# Patient Record
Sex: Male | Born: 1975 | Race: White | Hispanic: No | Marital: Married | Smoking: Current every day smoker
Health system: Southern US, Community
[De-identification: ages and names within clinical notes are randomized; demographics above are authoritative.]

## PROBLEM LIST (undated history)

## (undated) DIAGNOSIS — G4730 Sleep apnea, unspecified: Secondary | ICD-10-CM

## (undated) DIAGNOSIS — E7800 Pure hypercholesterolemia, unspecified: Secondary | ICD-10-CM

## (undated) DIAGNOSIS — R002 Palpitations: Secondary | ICD-10-CM

## (undated) DIAGNOSIS — I10 Essential (primary) hypertension: Secondary | ICD-10-CM

## (undated) DIAGNOSIS — I456 Pre-excitation syndrome: Secondary | ICD-10-CM

## (undated) DIAGNOSIS — I4891 Unspecified atrial fibrillation: Secondary | ICD-10-CM

---

## 2004-03-26 ENCOUNTER — Other Ambulatory Visit: Payer: Self-pay

## 2004-07-05 ENCOUNTER — Ambulatory Visit: Payer: Self-pay | Admitting: Anesthesiology

## 2004-08-02 ENCOUNTER — Ambulatory Visit: Payer: Self-pay | Admitting: Anesthesiology

## 2004-09-01 ENCOUNTER — Ambulatory Visit: Payer: Self-pay | Admitting: Anesthesiology

## 2004-10-13 ENCOUNTER — Ambulatory Visit: Payer: Self-pay | Admitting: Anesthesiology

## 2004-11-09 ENCOUNTER — Ambulatory Visit: Payer: Self-pay | Admitting: Anesthesiology

## 2004-12-06 ENCOUNTER — Ambulatory Visit: Payer: Self-pay | Admitting: Anesthesiology

## 2005-01-05 ENCOUNTER — Ambulatory Visit: Payer: Self-pay | Admitting: Anesthesiology

## 2005-02-07 ENCOUNTER — Ambulatory Visit: Payer: Self-pay | Admitting: Anesthesiology

## 2005-03-09 ENCOUNTER — Ambulatory Visit: Payer: Self-pay | Admitting: Anesthesiology

## 2005-03-17 ENCOUNTER — Ambulatory Visit: Payer: Self-pay | Admitting: Anesthesiology

## 2005-03-29 ENCOUNTER — Ambulatory Visit: Payer: Self-pay | Admitting: Anesthesiology

## 2005-04-26 ENCOUNTER — Ambulatory Visit: Payer: Self-pay | Admitting: Anesthesiology

## 2005-06-01 ENCOUNTER — Ambulatory Visit: Payer: Self-pay | Admitting: Anesthesiology

## 2005-06-27 ENCOUNTER — Ambulatory Visit: Payer: Self-pay | Admitting: Anesthesiology

## 2005-07-31 ENCOUNTER — Ambulatory Visit: Payer: Self-pay | Admitting: Anesthesiology

## 2005-08-28 ENCOUNTER — Ambulatory Visit: Payer: Self-pay | Admitting: Anesthesiology

## 2005-09-20 ENCOUNTER — Ambulatory Visit: Payer: Self-pay | Admitting: Anesthesiology

## 2005-10-31 ENCOUNTER — Ambulatory Visit: Payer: Self-pay | Admitting: Anesthesiology

## 2005-11-30 ENCOUNTER — Ambulatory Visit: Payer: Self-pay | Admitting: Anesthesiology

## 2005-12-28 ENCOUNTER — Ambulatory Visit: Payer: Self-pay | Admitting: Anesthesiology

## 2006-01-04 ENCOUNTER — Ambulatory Visit: Payer: Self-pay | Admitting: Anesthesiology

## 2006-01-04 ENCOUNTER — Other Ambulatory Visit: Payer: Self-pay

## 2006-01-23 ENCOUNTER — Ambulatory Visit: Payer: Self-pay | Admitting: Anesthesiology

## 2006-02-20 ENCOUNTER — Ambulatory Visit: Payer: Self-pay | Admitting: Anesthesiology

## 2006-02-25 ENCOUNTER — Emergency Department: Payer: Self-pay | Admitting: Emergency Medicine

## 2006-02-25 ENCOUNTER — Other Ambulatory Visit: Payer: Self-pay

## 2006-07-26 ENCOUNTER — Emergency Department: Payer: Self-pay | Admitting: Emergency Medicine

## 2007-11-16 ENCOUNTER — Ambulatory Visit: Payer: Self-pay | Admitting: Family Medicine

## 2007-12-09 ENCOUNTER — Encounter: Admission: RE | Admit: 2007-12-09 | Discharge: 2007-12-09 | Payer: Self-pay | Admitting: Neurosurgery

## 2008-03-16 ENCOUNTER — Emergency Department: Payer: Self-pay | Admitting: Emergency Medicine

## 2008-03-17 ENCOUNTER — Other Ambulatory Visit: Payer: Self-pay

## 2008-11-17 ENCOUNTER — Inpatient Hospital Stay: Payer: Self-pay | Admitting: Psychiatry

## 2008-11-20 ENCOUNTER — Ambulatory Visit: Payer: Self-pay | Admitting: Unknown Physician Specialty

## 2008-11-30 ENCOUNTER — Ambulatory Visit: Payer: Self-pay | Admitting: Unknown Physician Specialty

## 2008-12-31 ENCOUNTER — Ambulatory Visit: Payer: Self-pay | Admitting: Unknown Physician Specialty

## 2009-02-07 ENCOUNTER — Emergency Department: Payer: Self-pay | Admitting: Emergency Medicine

## 2011-04-27 ENCOUNTER — Ambulatory Visit: Payer: Self-pay | Admitting: Oral Surgery

## 2011-05-03 ENCOUNTER — Ambulatory Visit: Payer: Self-pay | Admitting: Oral Surgery

## 2011-11-03 ENCOUNTER — Emergency Department: Payer: Self-pay | Admitting: Unknown Physician Specialty

## 2016-01-18 ENCOUNTER — Ambulatory Visit
Admission: RE | Admit: 2016-01-18 | Discharge: 2016-01-18 | Disposition: A | Discharge status: Home / Self Care | Payer: Medicare Other | Source: Ambulatory Visit | Attending: Cardiovascular Disease | Admitting: Cardiovascular Disease

## 2016-01-18 ENCOUNTER — Ambulatory Visit: Payer: Medicare Other | Admitting: Anesthesiology

## 2016-01-18 ENCOUNTER — Encounter
Admission: RE | Disposition: A | Discharge status: Home / Self Care | Payer: Self-pay | Source: Ambulatory Visit | Attending: Cardiovascular Disease

## 2016-01-18 ENCOUNTER — Encounter: Payer: Self-pay | Admitting: *Deleted

## 2016-01-18 DIAGNOSIS — R002 Palpitations: Secondary | ICD-10-CM | POA: Insufficient documentation

## 2016-01-18 DIAGNOSIS — Z8249 Family history of ischemic heart disease and other diseases of the circulatory system: Secondary | ICD-10-CM | POA: Diagnosis not present

## 2016-01-18 DIAGNOSIS — Z888 Allergy status to other drugs, medicaments and biological substances status: Secondary | ICD-10-CM | POA: Insufficient documentation

## 2016-01-18 DIAGNOSIS — F17290 Nicotine dependence, other tobacco product, uncomplicated: Secondary | ICD-10-CM | POA: Diagnosis not present

## 2016-01-18 DIAGNOSIS — I493 Ventricular premature depolarization: Secondary | ICD-10-CM | POA: Insufficient documentation

## 2016-01-18 DIAGNOSIS — Z7951 Long term (current) use of inhaled steroids: Secondary | ICD-10-CM | POA: Diagnosis not present

## 2016-01-18 DIAGNOSIS — Z9889 Other specified postprocedural states: Secondary | ICD-10-CM | POA: Diagnosis not present

## 2016-01-18 DIAGNOSIS — Q211 Atrial septal defect: Secondary | ICD-10-CM | POA: Diagnosis not present

## 2016-01-18 DIAGNOSIS — Z79891 Long term (current) use of opiate analgesic: Secondary | ICD-10-CM | POA: Insufficient documentation

## 2016-01-18 DIAGNOSIS — Z79899 Other long term (current) drug therapy: Secondary | ICD-10-CM | POA: Insufficient documentation

## 2016-01-18 DIAGNOSIS — I4891 Unspecified atrial fibrillation: Secondary | ICD-10-CM | POA: Insufficient documentation

## 2016-01-18 DIAGNOSIS — I10 Essential (primary) hypertension: Secondary | ICD-10-CM | POA: Insufficient documentation

## 2016-01-18 DIAGNOSIS — I4892 Unspecified atrial flutter: Secondary | ICD-10-CM | POA: Diagnosis not present

## 2016-01-18 HISTORY — PX: ELECTROPHYSIOLOGIC STUDY: SHX172A

## 2016-01-18 HISTORY — DX: Sleep apnea, unspecified: G47.30

## 2016-01-18 HISTORY — DX: Palpitations: R00.2

## 2016-01-18 HISTORY — PX: TEE WITHOUT CARDIOVERSION: SHX5443

## 2016-01-18 HISTORY — DX: Essential (primary) hypertension: I10

## 2016-01-18 HISTORY — DX: Pure hypercholesterolemia, unspecified: E78.00

## 2016-01-18 SURGERY — ECHOCARDIOGRAM, TRANSESOPHAGEAL
Anesthesia: General

## 2016-01-18 MED ORDER — EPHEDRINE SULFATE 50 MG/ML IJ SOLN
INTRAMUSCULAR | Status: DC | PRN
  Administered 2016-01-18: 10 mg via INTRAVENOUS

## 2016-01-18 MED ORDER — SODIUM CHLORIDE 0.9 % IV SOLN
INTRAVENOUS | Status: DC
  Administered 2016-01-18: 07:00:00 via INTRAVENOUS

## 2016-01-18 MED ORDER — FENTANYL CITRATE (PF) 100 MCG/2ML IJ SOLN
INTRAMUSCULAR | Status: AC
  Administered 2016-01-18 (×2): 25 ug via INTRAVENOUS
  Administered 2016-01-18: 50 ug via INTRAVENOUS
  Filled 2016-01-18: qty 2

## 2016-01-18 MED ORDER — LIDOCAINE VISCOUS 2 % MT SOLN
OROMUCOSAL | Status: AC
  Filled 2016-01-18: qty 15

## 2016-01-18 MED ORDER — MIDAZOLAM HCL 5 MG/5ML IJ SOLN
INTRAMUSCULAR | Status: AC
  Administered 2016-01-18 (×3): 1 mg via INTRAVENOUS
  Filled 2016-01-18: qty 5

## 2016-01-18 MED ORDER — PROPOFOL 10 MG/ML IV BOLUS
INTRAVENOUS | Status: DC | PRN
  Administered 2016-01-18: 20 mg via INTRAVENOUS
  Administered 2016-01-18: 60 mg via INTRAVENOUS

## 2016-01-18 NOTE — Discharge Instructions (Signed)
Electrical Cardioversion, Care After °Refer to this sheet in the next few weeks. These instructions provide you with information on caring for yourself after your procedure. Your health care provider may also give you more specific instructions. Your treatment has been planned according to current medical practices, but problems sometimes occur. Call your health care provider if you have any problems or questions after your procedure. °WHAT TO EXPECT AFTER THE PROCEDURE °After your procedure, it is typical to have the following sensations: °· Some redness on the skin where the shocks were delivered. If this is tender, a sunburn lotion or hydrocortisone cream may help. °· Possible return of an abnormal heart rhythm within hours or days after the procedure. °HOME CARE INSTRUCTIONS °· Take medicines only as directed by your health care provider. Be sure you understand how and when to take your medicine. °· Learn how to feel your pulse and check it often. °· Limit your activity for 48 hours after the procedure or as directed by your health care provider. °· Avoid or minimize caffeine and other stimulants as directed by your health care provider. °SEEK MEDICAL CARE IF: °· You feel like your heart is beating too fast or your pulse is not regular. °· You have any questions about your medicines. °· You have bleeding that will not stop. °SEEK IMMEDIATE MEDICAL CARE IF: °· You are dizzy or feel faint. °· It is hard to breathe or you feel short of breath. °· There is a change in discomfort in your chest. °· Your speech is slurred or you have trouble moving an arm or leg on one side of your body. °· You get a serious muscle cramp that does not go away. °· Your fingers or toes turn cold or blue. °  °This information is not intended to replace advice given to you by your health care provider. Make sure you discuss any questions you have with your health care provider. °  °Document Released: 07/09/2013 Document Revised: 10/09/2014  Document Reviewed: 07/09/2013 °Elsevier Interactive Patient Education ©2016 [employer] °Transesophageal Echocardiogram °Transesophageal echocardiography (TEE) is a special type of test that produces images of the heart by using sound waves (echocardiogram). This type of echocardiography can obtain better images of the heart than standard echocardiography. TEE is done by passing a flexible tube down the esophagus. The heart is located in front of the esophagus. Because the heart and esophagus are close to one another, your health care provider can take very clear, detailed pictures of the heart via ultrasound waves. °TEE may be done: °· If your health care provider needs more information based on standard echocardiography findings. °· If you had a stroke. This might have happened because a clot formed in your heart. TEE can visualize different areas of the heart and check for clots. °· To check valve anatomy and function. °· To check for infection on the inside of your heart (endocarditis). °· To evaluate the dividing wall (septum) of the heart and presence of a hole that did not close after birth (patent foramen ovale or atrial septal defect). °· To help diagnose a tear in the wall of the aorta (aortic dissection). °· During cardiac valve surgery. This allows the surgeon to assess the valve repair before closing the chest. °· During a variety of other cardiac procedures to guide positioning of catheters. °· Sometimes before a cardioversion, which is a shock to convert heart rhythm back to normal. °LET YOUR HEALTH CARE PROVIDER KNOW ABOUT:  °· Any allergies you   have. °· All medicines you are taking, including vitamins, herbs, eye drops, creams, and over-the-counter medicines. °· Previous problems you or members of your family have had with the use of anesthetics. °· Any blood disorders you have. °· Previous surgeries you have had. °· Medical conditions you have. °· Swallowing difficulties. °· An esophageal  obstruction. °RISKS AND COMPLICATIONS  °Generally, TEE is a safe procedure. However, as with any procedure, complications can occur. Possible complications include an esophageal tear (rupture). °BEFORE THE PROCEDURE  °· Do not eat or drink for 6 hours before the procedure or as directed by your health care provider. °· Arrange for someone to drive you home after the procedure. Do not drive yourself home. During the procedure, you will be given medicines that can continue to make you feel drowsy and can impair your reflexes. °· An IV access tube will be started in the arm. °PROCEDURE  °· A medicine to help you relax (sedative) will be given through the IV access tube. °· A medicine may be sprayed or gargled to numb the back of the throat. °· Your blood pressure, heart rate, and breathing (vital signs) will be monitored during the procedure. °· The TEE probe is a long, flexible tube. The tip of the probe is placed into the back of the mouth, and you will be asked to swallow. This helps to pass the tip of the probe into the esophagus. Once the tip of the probe is in the correct area, your health care provider can take pictures of the heart. °· TEE is usually not a painful procedure. You may feel the probe press against the back of the throat. The probe does not enter the trachea and does not affect your breathing. °AFTER THE PROCEDURE  °· You will be in bed, resting, until you have fully returned to consciousness. °· When you first awaken, your throat may feel slightly sore and will probably still feel numb. This will improve slowly over time. °· You will not be allowed to eat or drink until it is clear that the numbness has improved. °· Once you have been able to drink, urinate, and sit on the edge of the bed without feeling sick to your stomach (nausea) or dizzy, you may be cleared to go home. °· You should have a friend or family member with you for the next 24 hours after your procedure. °  °This information is not  intended to replace advice given to you by your health care provider. Make sure you discuss any questions you have with your health care provider. °  °Document Released: 12/09/2002 Document Revised: 09/23/2013 Document Reviewed: 03/20/2013 °Elsevier Interactive Patient Education ©2016 [employer] ° °

## 2016-01-18 NOTE — Transfer of Care (Signed)
Immediate Anesthesia Transfer of Care Note  Patient: Randall KavaChristopher J Robar  Procedure(s) Performed: Procedure(s): TRANSESOPHAGEAL ECHOCARDIOGRAM (TEE) (N/A) CARDIOVERSION (N/A)  Patient Location: PACU and Short Stay  Anesthesia Type:General  Level of Consciousness: awake, alert  and oriented  Airway & Oxygen Therapy: Patient Spontanous Breathing and Patient connected to nasal cannula oxygen  Post-op Assessment: Report given to RN and Post -op Vital signs reviewed and stable  Post vital signs: Reviewed and stable  Last Vitals:  Filed Vitals:   01/18/16 0809 01/18/16 0810  BP:  104/58  Pulse: 54   Temp:    Resp: 15 11    Complications: No apparent anesthesia complications

## 2016-01-18 NOTE — Anesthesia Preprocedure Evaluation (Addendum)
Anesthesia Evaluation  Patient identified by MRN, date of birth, ID band Patient awake    Reviewed: Allergy & Precautions, NPO status , Patient's Chart, lab work & pertinent test results, reviewed documented beta blocker date and time   Airway Mallampati: II  TM Distance: >3 FB     Dental  (+) Chipped   Pulmonary sleep apnea ,           Cardiovascular hypertension, Pt. 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 Does not use CPAP. Gout. EKG discussed with cardiologist.  Reproductive/Obstetrics                            Anesthesia Physical Anesthesia Plan  ASA: III  Anesthesia Plan: General   Post-op Pain Management:    Induction: Intravenous  Airway Management Planned: Nasal Cannula  Additional Equipment:   Intra-op Plan:   Post-operative Plan:   Informed Consent: I have reviewed the patients History and Physical, chart, labs and discussed the procedure including the risks, benefits and alternatives for the proposed anesthesia with the patient or authorized representative who has indicated his/her understanding and acceptance.     Plan Discussed with: CRNA  Anesthesia Plan Comments:         Anesthesia Quick Evaluation

## 2016-01-18 NOTE — Progress Notes (Signed)
*  PRELIMINARY RESULTS* Echocardiogram Echocardiogram Transesophageal has been performed.  Randall HousekeeperJerry R Fields 01/18/2016, 8:08 AM

## 2016-01-18 NOTE — Progress Notes (Signed)
NAME:  Randall Fields   MRN: [id number] DOB:  07-26-1976   ADMIT DATE: 01/18/2016  Procedure: Electrical Cardioversion Indications:  Atrial Fibrillation  Procedure Details:    Time Out: Verified patient identification, verified procedure, site/side was marked, verified correct patient position, special equipment/implants available, medications/allergies/relevent history reviewed, required imaging and test results available.    Patient placed on cardiac monitor, pulse oximetry, supplemental oxygen as necessary.  Sedation given:  Pacer pads placed   Cardioverted . Normal sinus rhythm Cardioverted at 200 J Evaluation: Findings: Post procedure EKG shows: Normal sinus rhythm Complications: None Patient did well.     Laurier Nancy, M.D. [facility]   01/18/2016 8:59 AM

## 2016-01-18 NOTE — Anesthesia Postprocedure Evaluation (Signed)
Anesthesia Post Note  Patient: Randall Fields  Procedure(s) Performed: Procedure(s) (LRB): TRANSESOPHAGEAL ECHOCARDIOGRAM (TEE) (N/A) CARDIOVERSION (N/A)  Patient location during evaluation: Endoscopy Anesthesia Type: General Level of consciousness: awake and alert Pain management: pain level controlled Vital Signs Assessment: post-procedure vital signs reviewed and stable Respiratory status: spontaneous breathing, nonlabored ventilation, respiratory function stable and patient connected to nasal cannula oxygen Cardiovascular status: blood pressure returned to baseline and stable Postop Assessment: no signs of nausea or vomiting Anesthetic complications: no    Last Vitals:  Filed Vitals:   01/18/16 0954 01/18/16 0955  BP: 98/53 83/49  Pulse: 42 43  Temp:    Resp: 10 9    Last Pain: There were no vitals filed for this visit.               Brainard Highfill S

## 2016-01-18 NOTE — Anesthesia Procedure Notes (Signed)
Date/Time: 01/18/2016 7:47 AM Performed by: Stormy FabianURTIS, William Laske Pre-anesthesia Checklist: Patient identified, Emergency Drugs available, Suction available and Patient being monitored Patient Re-evaluated:Patient Re-evaluated prior to inductionOxygen Delivery Method: Nasal cannula Intubation Type: IV induction Dental Injury: Teeth and Oropharynx as per pre-operative assessment  Comments: Nasal cannula with etCO2 monitoring

## 2017-06-16 ENCOUNTER — Inpatient Hospital Stay
Admission: EM | Admit: 2017-06-16 | Discharge: 2017-06-18 | DRG: 310 | Disposition: A | Discharge status: Home / Self Care | Payer: MEDICARE | Attending: Internal Medicine | Admitting: Internal Medicine

## 2017-06-16 ENCOUNTER — Encounter: Payer: Self-pay | Admitting: Emergency Medicine

## 2017-06-16 ENCOUNTER — Emergency Department: Payer: MEDICARE

## 2017-06-16 DIAGNOSIS — I456 Pre-excitation syndrome: Secondary | ICD-10-CM

## 2017-06-16 DIAGNOSIS — R079 Chest pain, unspecified: Secondary | ICD-10-CM | POA: Diagnosis present

## 2017-06-16 DIAGNOSIS — Z9114 Patient's other noncompliance with medication regimen: Secondary | ICD-10-CM

## 2017-06-16 DIAGNOSIS — I480 Paroxysmal atrial fibrillation: Secondary | ICD-10-CM | POA: Diagnosis present

## 2017-06-16 DIAGNOSIS — Z888 Allergy status to other drugs, medicaments and biological substances status: Secondary | ICD-10-CM | POA: Diagnosis not present

## 2017-06-16 DIAGNOSIS — Z9119 Patient's noncompliance with other medical treatment and regimen: Secondary | ICD-10-CM | POA: Diagnosis not present

## 2017-06-16 DIAGNOSIS — I482 Chronic atrial fibrillation: Secondary | ICD-10-CM | POA: Diagnosis present

## 2017-06-16 DIAGNOSIS — I10 Essential (primary) hypertension: Secondary | ICD-10-CM | POA: Diagnosis not present

## 2017-06-16 DIAGNOSIS — E7800 Pure hypercholesterolemia, unspecified: Secondary | ICD-10-CM | POA: Diagnosis present

## 2017-06-16 DIAGNOSIS — E785 Hyperlipidemia, unspecified: Secondary | ICD-10-CM | POA: Diagnosis present

## 2017-06-16 DIAGNOSIS — G4733 Obstructive sleep apnea (adult) (pediatric): Secondary | ICD-10-CM | POA: Diagnosis not present

## 2017-06-16 HISTORY — DX: Pre-excitation syndrome: I45.6

## 2017-06-16 HISTORY — DX: Unspecified atrial fibrillation: I48.91

## 2017-06-16 LAB — CBC
HEMATOCRIT: 45.5 % (ref 40.0–52.0)
HEMOGLOBIN: 15.8 g/dL (ref 13.0–18.0)
MCH: 30 pg (ref 26.0–34.0)
MCHC: 34.8 g/dL (ref 32.0–36.0)
MCV: 86.3 fL (ref 80.0–100.0)
Platelets: 257 10*3/uL (ref 150–440)
RBC: 5.27 MIL/uL (ref 4.40–5.90)
RDW: 12.8 % (ref 11.5–14.5)
WBC: 10.2 10*3/uL (ref 3.8–10.6)

## 2017-06-16 LAB — TROPONIN I: Troponin I: 0.34 ng/mL (ref <0.03)

## 2017-06-16 LAB — COMPREHENSIVE METABOLIC PANEL
ALBUMIN: 4.3 g/dL (ref 3.5–5.0)
ALK PHOS: 30 U/L — AB (ref 38–126)
ALT: 29 U/L (ref 17–63)
AST: 33 U/L (ref 15–41)
Anion gap: 9 (ref 5–15)
BILIRUBIN TOTAL: 0.7 mg/dL (ref 0.3–1.2)
BUN: 20 mg/dL (ref 6–20)
CALCIUM: 9.5 mg/dL (ref 8.9–10.3)
CO2: 28 mmol/L (ref 22–32)
Chloride: 99 mmol/L — ABNORMAL LOW (ref 101–111)
Creatinine, Ser: 1.05 mg/dL (ref 0.61–1.24)
GFR calc Af Amer: >60 mL/min (ref >60)
GFR calc non Af Amer: >60 mL/min (ref >60)
GLUCOSE: 106 mg/dL — AB (ref 65–99)
Potassium: 4.2 mmol/L (ref 3.5–5.1)
SODIUM: 136 mmol/L (ref 135–145)
TOTAL PROTEIN: 8.2 g/dL — AB (ref 6.5–8.1)

## 2017-06-16 LAB — HEPARIN LEVEL (UNFRACTIONATED)

## 2017-06-16 LAB — PROTIME-INR
INR: 0.96
Prothrombin Time: 12.7 seconds (ref 11.4–15.2)

## 2017-06-16 LAB — APTT: aPTT: 28 seconds (ref 24–36)

## 2017-06-16 MED ORDER — HEPARIN BOLUS VIA INFUSION
4000.0000 [IU] | Freq: Once | INTRAVENOUS | Status: AC
  Administered 2017-06-16: 4000 [IU] via INTRAVENOUS
  Filled 2017-06-16: qty 4000

## 2017-06-16 MED ORDER — ACETAMINOPHEN 650 MG RE SUPP: 650.0000 mg | Freq: Four times a day (QID) | RECTAL | Status: DC | PRN

## 2017-06-16 MED ORDER — LORAZEPAM 2 MG/ML IJ SOLN
INTRAMUSCULAR | Status: AC
  Filled 2017-06-16: qty 1

## 2017-06-16 MED ORDER — BUPRENORPHINE HCL-NALOXONE HCL 8-2 MG SL SUBL
1.0000 | SUBLINGUAL_TABLET | Freq: Every day | SUBLINGUAL | Status: DC
  Administered 2017-06-17: 1 via SUBLINGUAL
  Filled 2017-06-16: qty 1

## 2017-06-16 MED ORDER — ONDANSETRON HCL 4 MG/2ML IJ SOLN: 4.0000 mg | Freq: Four times a day (QID) | INTRAMUSCULAR | Status: DC | PRN

## 2017-06-16 MED ORDER — INFLUENZA VAC SPLIT QUAD 0.5 ML IM SUSY
0.5000 mL | PREFILLED_SYRINGE | INTRAMUSCULAR | Status: AC
  Administered 2017-06-18: 0.5 mL via INTRAMUSCULAR
  Filled 2017-06-16: qty 0.5

## 2017-06-16 MED ORDER — BUPRENORPHINE HCL-NALOXONE HCL 8-2 MG SL SUBL: 0.5000 | SUBLINGUAL_TABLET | Freq: Every morning | SUBLINGUAL | Status: DC

## 2017-06-16 MED ORDER — LISINOPRIL-HYDROCHLOROTHIAZIDE 10-12.5 MG PO TABS: 1.0000 | ORAL_TABLET | Freq: Every day | ORAL | Status: DC

## 2017-06-16 MED ORDER — BUDESONIDE 0.25 MG/2ML IN SUSP
0.2500 mg | Freq: Two times a day (BID) | RESPIRATORY_TRACT | Status: DC
  Filled 2017-06-16: qty 2

## 2017-06-16 MED ORDER — HYDROCHLOROTHIAZIDE 12.5 MG PO CAPS
12.5000 mg | ORAL_CAPSULE | Freq: Every day | ORAL | Status: DC
  Administered 2017-06-17 – 2017-06-18 (×2): 12.5 mg via ORAL
  Filled 2017-06-16 (×2): qty 1

## 2017-06-16 MED ORDER — LISINOPRIL 10 MG PO TABS
10.0000 mg | ORAL_TABLET | Freq: Every day | ORAL | Status: DC
  Administered 2017-06-17 – 2017-06-18 (×2): 10 mg via ORAL
  Filled 2017-06-16 (×2): qty 1

## 2017-06-16 MED ORDER — OXYCODONE HCL 5 MG PO TABS: 5.0000 mg | ORAL_TABLET | ORAL | Status: DC | PRN

## 2017-06-16 MED ORDER — SODIUM CHLORIDE 0.9 % IV SOLN
INTRAVENOUS | Status: DC
  Administered 2017-06-16: via INTRAVENOUS

## 2017-06-16 MED ORDER — SODIUM CHLORIDE 0.9 % IV BOLUS (SEPSIS)
1000.0000 mL | Freq: Once | INTRAVENOUS | Status: AC
Start: 2017-06-16 — End: 2017-06-16
  Administered 2017-06-16: 1000 mL via INTRAVENOUS

## 2017-06-16 MED ORDER — ACETAMINOPHEN 325 MG PO TABS: 650.0000 mg | ORAL_TABLET | Freq: Four times a day (QID) | ORAL | Status: DC | PRN

## 2017-06-16 MED ORDER — BUPRENORPHINE HCL-NALOXONE HCL 8-2 MG SL SUBL: 1.0000 | SUBLINGUAL_TABLET | Freq: Every evening | SUBLINGUAL | Status: DC

## 2017-06-16 MED ORDER — BUPRENORPHINE HCL-NALOXONE HCL 8-2 MG SL SUBL: 0.5000 | SUBLINGUAL_TABLET | Freq: Every day | SUBLINGUAL | Status: DC

## 2017-06-16 MED ORDER — MAGNESIUM CITRATE PO SOLN
1.0000 | Freq: Once | ORAL | Status: DC | PRN
  Filled 2017-06-16: qty 296

## 2017-06-16 MED ORDER — ALBUTEROL SULFATE (2.5 MG/3ML) 0.083% IN NEBU: 2.5000 mg | INHALATION_SOLUTION | Freq: Four times a day (QID) | RESPIRATORY_TRACT | Status: DC | PRN

## 2017-06-16 MED ORDER — ETOMIDATE 2 MG/ML IV SOLN
0.1500 mg/kg | Freq: Once | INTRAVENOUS | Status: AC
  Administered 2017-06-16: 15.64 mg via INTRAVENOUS
  Filled 2017-06-16: qty 10

## 2017-06-16 MED ORDER — FLUTICASONE PROPIONATE (INHAL) 50 MCG/BLIST IN AEPB: 1.0000 | INHALATION_SPRAY | Freq: Two times a day (BID) | RESPIRATORY_TRACT | Status: DC

## 2017-06-16 MED ORDER — ZOLPIDEM TARTRATE 5 MG PO TABS: 5.0000 mg | ORAL_TABLET | Freq: Every evening | ORAL | Status: DC | PRN

## 2017-06-16 MED ORDER — LORAZEPAM 2 MG/ML IJ SOLN
2.0000 mg | Freq: Once | INTRAMUSCULAR | Status: AC
  Administered 2017-06-16: 2 mg via INTRAVENOUS

## 2017-06-16 MED ORDER — ONDANSETRON HCL 4 MG PO TABS: 4.0000 mg | ORAL_TABLET | Freq: Four times a day (QID) | ORAL | Status: DC | PRN

## 2017-06-16 MED ORDER — ASPIRIN 81 MG PO CHEW
324.0000 mg | CHEWABLE_TABLET | Freq: Once | ORAL | Status: AC
  Administered 2017-06-16: 324 mg via ORAL
  Filled 2017-06-16: qty 4

## 2017-06-16 MED ORDER — IPRATROPIUM BROMIDE 0.02 % IN SOLN: 0.5000 mg | Freq: Four times a day (QID) | RESPIRATORY_TRACT | Status: DC | PRN

## 2017-06-16 MED ORDER — HEPARIN (PORCINE) IN NACL 100-0.45 UNIT/ML-% IJ SOLN
1600.0000 [IU]/h | INTRAMUSCULAR | Status: DC
  Administered 2017-06-16: 1250 [IU]/h via INTRAVENOUS
  Administered 2017-06-17: 1450 [IU]/h via INTRAVENOUS
  Administered 2017-06-18: 1600 [IU]/h via INTRAVENOUS
  Filled 2017-06-16 (×3): qty 250

## 2017-06-16 MED ORDER — SENNOSIDES-DOCUSATE SODIUM 8.6-50 MG PO TABS: 1.0000 | ORAL_TABLET | Freq: Every evening | ORAL | Status: DC | PRN

## 2017-06-16 MED ORDER — BISACODYL 5 MG PO TBEC: 5.0000 mg | DELAYED_RELEASE_TABLET | Freq: Every day | ORAL | Status: DC | PRN

## 2017-06-16 NOTE — Progress Notes (Signed)
ANTICOAGULATION CONSULT NOTE - Initial Consult  Pharmacy Consult for Heparin Drip dosing and monitoring  Indication: chest pain/ACS  Allergies  Allergen Reactions  . Ceclor [Cefaclor]     hives    Patient Measurements: Height:  (188 cm) Weight: 230 lb (104.3 kg) IBW/kg (Calculated) : 82.2  Vital Signs: Temp: 98.1 F (36.7 C) (09/15 1832) Temp Source: Oral (09/15 1832) BP: 156/79 (09/15 2031) Pulse Rate: 68 (09/15 2031)  Labs:  Recent Labs  06/16/17 1833 06/16/17 2001  HGB 15.8  --   HCT 45.5  --   PLT 257  --   APTT  --  28  LABPROT  --  12.7  INR  --  0.96  CREATININE 1.05  --   TROPONINI 0.34*  --     Estimated Creatinine Clearance: 121.6 mL/min (by C-G formula based on SCr of 1.05 mg/dL).   Medical History: Past Medical History:  Diagnosis Date  . Atrial fibrillation (HCC)   . Hypercholesteremia   . Hypertension   . Palpitation   . Sleep apnea   . Wolff-Parkinson-White syndrome     Assessment: Pharmacy consulted for heparin dosing and monitoring in a 41 yo male for ACS/Chest Pain.   Goal of Therapy:  Heparin level 0.3-0.7 units/ml Monitor platelets by anticoagulation protocol: Yes   Plan:  Baseline labs ordered  Give 4000 units bolus x 1 Start heparin infusion at 1250 units/hr Check anti-Xa level in 6 hours and daily while on heparin Continue to monitor H&H and platelets  Gardner Candle, PharmD, BCPS Clinical Pharmacist 06/16/2017 8:49 PM

## 2017-06-16 NOTE — ED Provider Notes (Signed)
[facility] Department Provider Note    First MD Initiated Contact with Patient 06/16/17 Margretta Ditty     (approximate)  I have reviewed the triage vital signs and the nursing notes.   HISTORY  Chief Complaint Chest Pain    HPI DEAUNDRA KUTZER is a 41 y.o. male with a history of paroxysmal A. fib as well as a history WPW requiring cardioversions in the past presents with 24 hours of palpitations and chest pressure and tightness that is new for him. Has never had pain or pressure like this in the past but does have palpitations. No diaphoresis or nausea no vomiting. No recent fevers. States that he feels like all this started when he had a cold drink of milk last night.   Past Medical History:  Diagnosis Date  . Atrial fibrillation (HCC)   . Hypercholesteremia   . Hypertension   . Palpitation   . Sleep apnea   . Wolff-Parkinson-White syndrome    No family history on file. Past Surgical History:  Procedure Laterality Date  . ELECTROPHYSIOLOGIC STUDY N/A 01/18/2016   Procedure: CARDIOVERSION;  Surgeon: Laurier Nancy, MD;  Location: [facility] ORS;  Service: Cardiovascular;  Laterality: N/A;  . TEE WITHOUT CARDIOVERSION N/A 01/18/2016   Procedure: TRANSESOPHAGEAL ECHOCARDIOGRAM (TEE);  Surgeon: Laurier Nancy, MD;  Location: [facility] ORS;  Service: Cardiovascular;  Laterality: N/A;   There are no active problems to display for this patient.     Prior to Admission medications   Medication Sig Start Date End Date Taking? Authorizing Provider  fluticasone (FLOVENT DISKUS) 50 MCG/BLIST diskus inhaler Inhale 1 puff into the lungs 2 (two) times daily.    [provider]  lisinopril-hydrochlorothiazide (PRINZIDE,ZESTORETIC) 10-12.5 MG tablet Take 1 tablet by mouth daily.    [provider]  methadone (DOLOPHINE) 10 MG tablet Take 10 mg by mouth daily.    [provider]  rivaroxaban (XARELTO) 20 MG TABS tablet Take 20 mg by mouth  daily with supper.    [provider]    Allergies Ceclor [cefaclor]    Social History Social History  Substance Use Topics  . Smoking status: Never Smoker  . Smokeless tobacco: Current User  . Alcohol use No    Review of Systems Patient denies headaches, rhinorrhea, blurry vision, numbness, shortness of breath, chest pain, edema, cough, abdominal pain, nausea, vomiting, diarrhea, dysuria, fevers, rashes or hallucinations unless otherwise stated above in HPI. ____________________________________________   PHYSICAL EXAM:  VITAL SIGNS: Vitals:   06/16/17 2030 06/16/17 2031  BP: (!) 156/79 (!) 156/79  Pulse: 63 68  Resp: 16 18  Temp:    SpO2: 100% 99%    Constitutional: Alert and oriented. Well appearing and in no acute distress. Eyes: Conjunctivae are normal.  Head: Atraumatic. Nose: No congestion/rhinnorhea. Mouth/Throat: Mucous membranes are moist.   Neck: No stridor. Painless ROM.  Cardiovascular: irregularly irregular rhythm. Grossly normal heart sounds.  Good peripheral circulation. Respiratory: Normal respiratory effort.  No retractions. Lungs CTAB. Gastrointestinal: Soft and nontender. No distention. No abdominal bruits. No CVA tenderness. Musculoskeletal: No lower extremity tenderness nor edema.  No joint effusions. Neurologic:  Normal speech and language. No gross focal neurologic deficits are appreciated. No facial droop Skin:  Skin is warm, dry and intact. No rash noted. Psychiatric: Mood and affect are normal. Speech and behavior are normal.  ____________________________________________   LABS (all labs ordered are listed, but only abnormal results are displayed)  Results for orders placed or performed  during the hospital encounter of 06/16/17 (from the past 24 hour(s))  CBC     Status: None   Collection Time: 06/16/17  6:33 PM  Result Value Ref Range   WBC 10.2 3.8 - 10.6 K/uL   RBC 5.27 4.40 - 5.90 MIL/uL   Hemoglobin 15.8 13.0 - 18.0  g/dL   HCT 16.1 09.6 - 04.5 %   MCV 86.3 80.0 - 100.0 fL   MCH 30.0 26.0 - 34.0 pg   MCHC 34.8 32.0 - 36.0 g/dL   RDW 40.9 81.1 - 91.4 %   Platelets 257 150 - 440 K/uL  Troponin I     Status: Abnormal   Collection Time: 06/16/17  6:33 PM  Result Value Ref Range   Troponin I 0.34 (HH) <0.03 ng/mL  Comprehensive metabolic panel     Status: Abnormal   Collection Time: 06/16/17  6:33 PM  Result Value Ref Range   Sodium 136 135 - 145 mmol/L   Potassium 4.2 3.5 - 5.1 mmol/L   Chloride 99 (L) 101 - 111 mmol/L   CO2 28 22 - 32 mmol/L   Glucose, Bld 106 (H) 65 - 99 mg/dL   BUN 20 6 - 20 mg/dL   Creatinine, Ser 7.82 0.61 - 1.24 mg/dL   Calcium 9.5 8.9 - 95.6 mg/dL   Total Protein 8.2 (H) 6.5 - 8.1 g/dL   Albumin 4.3 3.5 - 5.0 g/dL   AST 33 15 - 41 U/L   ALT 29 17 - 63 U/L   Alkaline Phosphatase 30 (L) 38 - 126 U/L   Total Bilirubin 0.7 0.3 - 1.2 mg/dL   GFR calc non Af Amer >60 >60 mL/min   GFR calc Af Amer >60 >60 mL/min   Anion gap 9 5 - 15  APTT     Status: None   Collection Time: 06/16/17  8:01 PM  Result Value Ref Range   aPTT 28 24 - 36 seconds  Protime-INR     Status: None   Collection Time: 06/16/17  8:01 PM  Result Value Ref Range   Prothrombin Time 12.7 11.4 - 15.2 seconds   INR 0.96    ____________________________________________  EKG My review and personal interpretation at Time: 18:23   Indication: chest pain  Rate: 95  Rhythm: afib Axis: normal Other: wpw, afib with ner t wave changes in inferior leads as compared to previous, no stemi criteria   My review and personal interpretation at Time: 20:34   Indication: chest pain  Rate: 60  Rhythm: sinus Axis: normal Other: wpw, sinus rhythm ____________________________________________  RADIOLOGY  I personally reviewed all radiographic images ordered to evaluate for the above acute complaints and reviewed radiology reports and findings.  These findings were personally discussed with the patient.  Please see  medical record for radiology report.  ____________________________________________   PROCEDURES  Procedure(s) performed:  .Cardioversion Date/Time: 06/16/2017 8:28 PM Performed by: Willy Eddy Authorized by: Willy Eddy   Consent:    Consent obtained:  Verbal   Consent given by:  Patient   Risks discussed:  Death, induced arrhythmia and pain   Alternatives discussed:  No treatment, rate-control medication, alternative treatment and referral Pre-procedure details:    Cardioversion basis:  Emergent   Rhythm:  Atrial fibrillation   Electrode placement:  Anterior-posterior Attempt one:    Cardioversion mode:  Synchronous   Waveform:  Biphasic   Shock (Joules):  100   Shock outcome:  Conversion to normal sinus rhythm Post-procedure details:  Patient status:  Awake   Patient tolerance of procedure:  Tolerated well, no immediate complications      Critical Care performed: yes CRITICAL CARE Performed by: Willy Eddy   Total critical care time: 40 minutes  Critical care time was exclusive of separately billable procedures and treating other patients.  Critical care was necessary to treat or prevent imminent or life-threatening deterioration.  Critical care was time spent personally by me on the following activities: development of treatment plan with patient and/or surrogate as well as nursing, discussions with consultants, evaluation of patient's response to treatment, examination of patient, obtaining history from patient or surrogate, ordering and performing treatments and interventions, ordering and review of laboratory studies, ordering and review of radiographic studies, pulse oximetry and re-evaluation of patient's condition.  ____________________________________________   INITIAL IMPRESSION / ASSESSMENT AND PLAN / ED COURSE  Pertinent labs & imaging results that were available during my care of the patient were reviewed by me and considered in my  medical decision making (see chart for details).  DDX: ACS, pericarditis, esophagitis, boerhaaves, pe, dissection, pna, bronchitis, costochondritis   ZAKARIA SEDOR is a 41 y.o. who presents to the ED with chest pain as described above. Patient is in A. fib with RVR. Patient does have history of WPW and has required cardioversions in the past. As he is having some discomfort since to be quite symptomatic from this I spoke with his cardiologist, Dr. Rickey Primus, regarding the patient's presentation. He has recommended cardioversion in the ER is the onset was clearly within the past 24 hours.  Discussed the risks and benefits with patient and family at bedside regarding sedation and neurological cardioversion in the ER. They have consented after discussion.  Clinical Course as of Jun 17 2043  Sat Jun 16, 2017  2025 Patient was cardioverted without, location. Now back in a sinus rhythm at a rate of 79.  [PR]    Clinical Course User Index [PR] Willy Eddy, MD   Patient remains in sinus rhythm he will dynamically stable. As he does have EKG changes with a positive troponin with typical chest pain will initiate heparin and was given aspirin. I do believe that he'll benefit from serial enzymes and hemodynamic monitoring in the hospital.  ____________________________________________   FINAL CLINICAL IMPRESSION(S) / ED DIAGNOSES  Final diagnoses:  Chest pain, unspecified type  Paroxysmal atrial fibrillation (HCC)  WPW (Wolff-Parkinson-White syndrome)      NEW MEDICATIONS STARTED DURING THIS VISIT:  New Prescriptions   No medications on file     Note:  This document was prepared using Dragon voice recognition software and may include unintentional dictation errors.    Willy Eddy, MD 06/16/17 2214

## 2017-06-16 NOTE — ED Triage Notes (Signed)
States began chest pain today. States has history of wolf parkinson white and during EKG when noted a fib states also history of a fib but not normally in that rhythm. States that he felt like his rhythm began irregular yesterday.

## 2017-06-16 NOTE — Progress Notes (Signed)
Pt. Arrived via stretcher and walked to bed with stand by assist. Tele called to CCMD, pt in SR, verified with Nallely, CNA. Skin assessed with Claris Gower, RN. Skin warm, dry and intact with no issues observed. Pt. Alert and oriented x4, with no c/o pain, SOB or acute distress noted. VSS. General room orientation given. Instruction on how to use ascom system and call bell system given. Red allergy braclet placed on pt. Fall risk contract explained to pt. And signed by pt. Heparin gtt running at 12.5. Will continue to monitor pt.

## 2017-06-16 NOTE — ED Notes (Signed)
Butch RN, aware of bed assigned  

## 2017-06-16 NOTE — H&P (Signed)
History and Physical   [facility] - [place] [facility] Admission History and Physical [employer], D.O.    Patient Name: Randall Fields MR#: [id number] Date of Birth: [birth year] Date of Admission: 06/16/2017  Referring MD/NP/PA: Dr. Roxan Hockey Primary Care Physician: Fayrene Helper, NP  Chief Complaint:  Chief Complaint  Patient presents with  . Chest Pain    HPI: Randall Fields is a 41 y.o. male with a known history of afib, WPW s/p multiple cardioversions, HTN, HLD, OSA presents to the emergency department for evaluation of chest pain.  Patient was in a usual state of health until yesterday when he drank a glass of cold milk and began feeling heaviness across his chest and pain into his neck.  He tried vagal maneuvers which did not help.  He has had multiple cardioversions in the past for WPW, last one was about one year ago. He has not been taking his Xarelto in over a year.   Patient denies fevers/chills, weakness, dizziness, shortness of breath, N/V/C/D, abdominal pain, dysuria/frequency, changes in mental status.    Otherwise there has been no change in status. Patient has been taking medication as prescribed and there has been no recent change in medication or diet.  No recent antibiotics.  There has been no recent illness, hospitalizations, travel or sick contacts.    EMS/ED Course: Patient was sedated and cardioverted to sinus rhythm with one 100J shock in the ED. Medical admission has been requested for further management of WPW.  Review of Systems:  CONSTITUTIONAL: No fever/chills, fatigue, weakness, weight gain/loss, headache. EYES: No blurry or double vision. ENT: No tinnitus, postnasal drip, redness or soreness of the oropharynx. RESPIRATORY: No cough, dyspnea, wheeze.  No hemoptysis.  CARDIOVASCULAR: Positive chest pain, palpitations, negative syncope, orthopnea. No lower extremity edema.  GASTROINTESTINAL: No nausea, vomiting, abdominal pain,  diarrhea, constipation.  No hematemesis, melena or hematochezia. GENITOURINARY: No dysuria, frequency, hematuria. ENDOCRINE: No polyuria or nocturia. No heat or cold intolerance. HEMATOLOGY: No anemia, bruising, bleeding. INTEGUMENTARY: No rashes, ulcers, lesions. MUSCULOSKELETAL: No arthritis, gout, dyspnea. NEUROLOGIC: No numbness, tingling, ataxia, seizure-type activity, weakness. PSYCHIATRIC: No anxiety, depression, insomnia.   Past Medical History:  Diagnosis Date  . Atrial fibrillation (HCC)   . Hypercholesteremia   . Hypertension   . Palpitation   . Sleep apnea   . Wolff-Parkinson-White syndrome     Past Surgical History:  Procedure Laterality Date  . ELECTROPHYSIOLOGIC STUDY N/A 01/18/2016   Procedure: CARDIOVERSION;  Surgeon: Laurier Nancy, MD;  Location: [facility] ORS;  Service: Cardiovascular;  Laterality: N/A;  . TEE WITHOUT CARDIOVERSION N/A 01/18/2016   Procedure: TRANSESOPHAGEAL ECHOCARDIOGRAM (TEE);  Surgeon: Laurier Nancy, MD;  Location: [facility] ORS;  Service: Cardiovascular;  Laterality: N/A;     reports that he has never smoked. He uses smokeless tobacco. He reports that he does not drink alcohol or use drugs.  Allergies  Allergen Reactions  . Ceclor [Cefaclor]     hives    No family history on file.  Prior to Admission medications   Medication Sig Start Date End Date Taking? Authorizing Provider  fluticasone (FLOVENT DISKUS) 50 MCG/BLIST diskus inhaler Inhale 1 puff into the lungs 2 (two) times daily.    [provider]  lisinopril-hydrochlorothiazide (PRINZIDE,ZESTORETIC) 10-12.5 MG tablet Take 1 tablet by mouth daily.    [provider]  methadone (DOLOPHINE) 10 MG tablet Take 10 mg by mouth daily.    [provider]  rivaroxaban (XARELTO) 20 MG TABS tablet Take  20 mg by mouth daily with supper.    [provider]    Physical Exam: Vitals:   06/16/17 2025 06/16/17 2030 06/16/17 2031 06/16/17 2054  BP: (!) 181/84 (!)  156/79 (!) 156/79 137/72  Pulse: 83 63 68 65  Resp: 18 16 18 16   Temp:      TempSrc:      SpO2: 100% 100% 99% 94%  Weight:      Height:        GENERAL: 41 y.o.-year-old white male patient, well-developed, well-nourished lying in the bed in no acute distress.  Pleasant and cooperative.   HEENT: Head atraumatic, normocephalic. Pupils equal. Mucus membranes moist. NECK: Supple, full range of motion. No JVD, no bruit heard. No thyroid enlargement, no tenderness, no cervical lymphadenopathy. CHEST: Normal breath sounds bilaterally. No wheezing, rales, rhonchi or crackles. No use of accessory muscles of respiration.  No reproducible chest wall tenderness.  CARDIOVASCULAR: S1, S2 normal. No murmurs, rubs, or gallops. Cap refill <2 seconds. Pulses intact distally.  ABDOMEN: Soft, nondistended, nontender. No rebound, guarding, rigidity. Normoactive bowel sounds present in all four quadrants.  EXTREMITIES: No pedal edema, cyanosis, or clubbing. No calf tenderness or Homan's sign.  NEUROLOGIC: The patient is alert and oriented x 3. Cranial nerves II through XII are grossly intact with no focal sensorimotor deficit. PSYCHIATRIC:  Normal affect, mood, thought content. SKIN: Warm, dry, and intact without obvious rash, lesion, or ulcer.    Labs on Admission:  CBC:  Recent Labs Lab 06/16/17 1833  WBC 10.2  HGB 15.8  HCT 45.5  MCV 86.3  PLT 257   Basic Metabolic Panel:  Recent Labs Lab 06/16/17 1833  NA 136  K 4.2  CL 99*  CO2 28  GLUCOSE 106*  BUN 20  CREATININE 1.05  CALCIUM 9.5   GFR: Estimated Creatinine Clearance: 121.6 mL/min (by C-G formula based on SCr of 1.05 mg/dL). Liver Function Tests:  Recent Labs Lab 06/16/17 1833  AST 33  ALT 29  ALKPHOS 30*  BILITOT 0.7  PROT 8.2*  ALBUMIN 4.3   No results for input(s): LIPASE, AMYLASE in the last 168 hours. No results for input(s): AMMONIA in the last 168 hours. Coagulation Profile:  Recent Labs Lab 06/16/17 2001   INR 0.96   Cardiac Enzymes:  Recent Labs Lab 06/16/17 1833  TROPONINI 0.34*   BNP (last 3 results) No results for input(s): PROBNP in the last 8760 hours. HbA1C: No results for input(s): HGBA1C in the last 72 hours. CBG: No results for input(s): GLUCAP in the last 168 hours. Lipid Profile: No results for input(s): CHOL, HDL, LDLCALC, TRIG, CHOLHDL, LDLDIRECT in the last 72 hours. Thyroid Function Tests: No results for input(s): TSH, T4TOTAL, FREET4, T3FREE, THYROIDAB in the last 72 hours. Anemia Panel: No results for input(s): VITAMINB12, FOLATE, FERRITIN, TIBC, IRON, RETICCTPCT in the last 72 hours. Urine analysis: No results found for: COLORURINE, APPEARANCEUR, LABSPEC, PHURINE, GLUCOSEU, HGBUR, BILIRUBINUR, KETONESUR, PROTEINUR, UROBILINOGEN, NITRITE, LEUKOCYTESUR Sepsis Labs: @LABRCNTIP (procalcitonin:4,lacticidven:4) )No results found for this or any previous visit (from the past 240 hour(s)).   Radiological Exams on Admission: Dg Chest 2 View  Result Date: 06/16/2017 CLINICAL DATA:  Chest pain. History of Wolff Parkinson White syndrome EXAM: CHEST  2 VIEW COMPARISON:  November 16, 2008 FINDINGS: There is no edema or consolidation. The heart size and pulmonary vascularity are normal. No adenopathy. No pneumothorax. No bone lesions. IMPRESSION: No edema or consolidation. Electronically Signed   By: Bretta Bang III M.D.  On: 06/16/2017 19:42    EKG: Afib with WPW at 95 bpm with normal axis, TWI in II, II aVF and nonspecific ST-T wave changes.   Assessment/Plan  This is a 42 y.o. male with a history of afib, WPW s/p multiple cardioversions, HTN, HLD, OSA now being admitted with:  #. WPW s/p cardioversion, elevated troponins - Admit inpatient, telemetry - Trend trops - Heparin drip - Check echo - Cardiology consultation requested  #. H/O opiate use disorder - Continue suboxone  #. H/O HTN - Continue lisinopril/HCTZ  Admission status: Inpatient, tele IV  Fluids: NS Diet/Nutrition: NPO Consults called: Cardio  DVT Px: Heparin, SCDs and early ambulation. Code Status: Full Code  Disposition Plan: To home in 1-2 days  All the records are reviewed and case discussed with ED provider. Management plans discussed with the patient and/or family who express understanding and agree with plan of care.  Lanson Randle D.O. on 06/16/2017 at 8:56 PM Between 7am to 6pm - Pager - [phone] After 6pm go to [email or web address] - [facility] Office [phone] CC: Primary care physician; Fayrene Helper, NP   06/16/2017, 8:56 PM

## 2017-06-17 ENCOUNTER — Inpatient Hospital Stay
Admit: 2017-06-17 | Discharge: 2017-06-17 | Disposition: A | Discharge status: Home / Self Care | Payer: MEDICARE | Attending: Family Medicine | Admitting: Family Medicine

## 2017-06-17 DIAGNOSIS — I480 Paroxysmal atrial fibrillation: Secondary | ICD-10-CM | POA: Diagnosis not present

## 2017-06-17 DIAGNOSIS — I456 Pre-excitation syndrome: Secondary | ICD-10-CM | POA: Diagnosis not present

## 2017-06-17 LAB — BASIC METABOLIC PANEL
ANION GAP: 6 (ref 5–15)
BUN: 20 mg/dL (ref 6–20)
CHLORIDE: 102 mmol/L (ref 101–111)
CO2: 27 mmol/L (ref 22–32)
Calcium: 8.7 mg/dL — ABNORMAL LOW (ref 8.9–10.3)
Creatinine, Ser: 0.86 mg/dL (ref 0.61–1.24)
GFR calc Af Amer: >60 mL/min (ref >60)
GLUCOSE: 105 mg/dL — AB (ref 65–99)
POTASSIUM: 4.3 mmol/L (ref 3.5–5.1)
Sodium: 135 mmol/L (ref 135–145)

## 2017-06-17 LAB — LIPID PANEL
CHOL/HDL RATIO: 6.2 ratio
CHOLESTEROL: 167 mg/dL (ref 0–200)
HDL: 27 mg/dL — AB (ref >40)
LDL Cholesterol: 72 mg/dL (ref 0–99)
Triglycerides: 342 mg/dL — ABNORMAL HIGH (ref <150)
VLDL: 68 mg/dL — ABNORMAL HIGH (ref 0–40)

## 2017-06-17 LAB — MAGNESIUM: Magnesium: 1.8 mg/dL (ref 1.7–2.4)

## 2017-06-17 LAB — PHOSPHORUS: Phosphorus: 3.8 mg/dL (ref 2.5–4.6)

## 2017-06-17 LAB — ECHOCARDIOGRAM COMPLETE
E/e' ratio: 12.04
EWDT: 275 ms
FS: 35 % (ref 28–44)
HEIGHTINCHES: 74 in
IVS/LV PW RATIO, ED: 0.99
LA ID, A-P, ES: 50 mm
LA diam end sys: 50 mm
LA diam index: 2.12 cm/m2
LAVOLA4C: 78.8 mL
LV E/e'average: 12.04
LV PW d: 12.2 mm — AB (ref 0.6–1.1)
LV e' LATERAL: 6.53 cm/s
LVEEMED: 12.04
MV Dec: 275
MV Peak grad: 2 mmHg
MV pk A vel: 57.7 m/s
MVPKEVEL: 78.6 m/s
RV LATERAL S' VELOCITY: 11.7 cm/s
RV TAPSE: 22.5 mm
TDI e' lateral: 6.53
TDI e' medial: 3.92
WEIGHTICAEL: 3680 [oz_av]

## 2017-06-17 LAB — CBC
HEMATOCRIT: 40.6 % (ref 40.0–52.0)
HEMOGLOBIN: 14.3 g/dL (ref 13.0–18.0)
MCH: 30.4 pg (ref 26.0–34.0)
MCHC: 35.1 g/dL (ref 32.0–36.0)
MCV: 86.5 fL (ref 80.0–100.0)
Platelets: 232 10*3/uL (ref 150–440)
RBC: 4.7 MIL/uL (ref 4.40–5.90)
RDW: 13.1 % (ref 11.5–14.5)
WBC: 9.7 10*3/uL (ref 3.8–10.6)

## 2017-06-17 LAB — TROPONIN I
TROPONIN I: 0.17 ng/mL — AB (ref <0.03)
Troponin I: 0.23 ng/mL (ref <0.03)
Troponin I: 0.29 ng/mL (ref <0.03)

## 2017-06-17 LAB — HEPARIN LEVEL (UNFRACTIONATED)
Heparin Unfractionated: 0.28 IU/mL — ABNORMAL LOW (ref 0.30–0.70)
Heparin Unfractionated: 0.43 IU/mL (ref 0.30–0.70)
Heparin Unfractionated: 0.26 IU/mL — ABNORMAL LOW (ref 0.30–0.70)

## 2017-06-17 LAB — TSH: TSH: 2.331 u[IU]/mL (ref 0.350–4.500)

## 2017-06-17 MED ORDER — HEPARIN BOLUS VIA INFUSION
1500.0000 [IU] | Freq: Once | INTRAVENOUS | Status: AC
  Administered 2017-06-17: 1500 [IU] via INTRAVENOUS
  Filled 2017-06-17: qty 1500

## 2017-06-17 NOTE — Progress Notes (Signed)
[facility] - [place] at [facility]   PATIENT NAME: Randall Fields    MR#:  [id number]  DATE OF BIRTH:  02-23-1976  SUBJECTIVE: Admitted for A. fib with RVR, heart rate was 90 in the emergency room. Cardioverted with 100 joules of synchronous cardioversion in the emergency room. Patient denies any chest pain or shortness of breath. Patient heart rate is 70 bpm in sinus rhythm.   CHIEF COMPLAINT:   Chief Complaint  Patient presents with  . Chest Pain    REVIEW OF SYSTEMS:    Review of Systems  Constitutional: Negative for chills and fever.  HENT: Negative for hearing loss.   Eyes: Negative for blurred vision, double vision and photophobia.  Respiratory: Negative for cough, hemoptysis and shortness of breath.   Cardiovascular: Negative for palpitations, orthopnea and leg swelling.  Gastrointestinal: Negative for abdominal pain, diarrhea and vomiting.  Genitourinary: Negative for dysuria and urgency.  Musculoskeletal: Negative for myalgias and neck pain.  Skin: Negative for rash.  Neurological: Negative for dizziness, focal weakness, seizures, weakness and headaches.  Psychiatric/Behavioral: Negative for memory loss. The patient does not have insomnia.     Nutrition: Tolerating Diet: Tolerating PT:      DRUG ALLERGIES:   Allergies  Allergen Reactions  . Ceclor [Cefaclor]     hives    VITALS:  Blood pressure 125/67, pulse 62, temperature 98 F (36.7 C), resp. rate 17, height 6\' 2"  (1.88 m), weight 104.3 kg (230 lb), SpO2 92 %.  PHYSICAL EXAMINATION:   Physical Exam  GENERAL:  41 y.o.-year-old patient lying in the bed with no acute distress.  EYES: Pupils equal, round, reactive to light and accommodation. No scleral icterus. Extraocular muscles intact.  HEENT: Head atraumatic, normocephalic. Oropharynx and nasopharynx clear.  NECK:  Supple, no jugular venous distention. No thyroid enlargement, no tenderness.  LUNGS: Normal breath sounds  bilaterally, no wheezing, rales,rhonchi or crepitation. No use of accessory muscles of respiration.  CARDIOVASCULAR: S1, S2 normal. No murmurs, rubs, or gallops.  ABDOMEN: Soft, nontender, nondistended. Bowel sounds present. No organomegaly or mass.  EXTREMITIES: No pedal edema, cyanosis, or clubbing.  NEUROLOGIC: Cranial nerves II through XII are intact. Muscle strength 5/5 in all extremities. Sensation intact. Gait not checked.  PSYCHIATRIC: The patient is alert and oriented x 3.  SKIN: No obvious rash, lesion, or ulcer.    LABORATORY PANEL:   CBC  Recent Labs Lab 06/17/17 0315  WBC 9.7  HGB 14.3  HCT 40.6  PLT 232   ------------------------------------------------------------------------------------------------------------------  Chemistries   Recent Labs Lab 06/16/17 1833 06/17/17 0315  NA 136 135  K 4.2 4.3  CL 99* 102  CO2 28 27  GLUCOSE 106* 105*  BUN 20 20  CREATININE 1.05 0.86  CALCIUM 9.5 8.7*  MG  --  1.8  AST 33  --   ALT 29  --   ALKPHOS 30*  --   BILITOT 0.7  --    ------------------------------------------------------------------------------------------------------------------  Cardiac Enzymes  Recent Labs Lab 06/17/17 0620  TROPONINI 0.23*   ------------------------------------------------------------------------------------------------------------------  RADIOLOGY:  Dg Chest 2 View  Result Date: 06/16/2017 CLINICAL DATA:  Chest pain. History of Wolff Parkinson White syndrome EXAM: CHEST  2 VIEW COMPARISON:  November 16, 2008 FINDINGS: There is no edema or consolidation. The heart size and pulmonary vascularity are normal. No adenopathy. No pneumothorax. No bone lesions. IMPRESSION: No edema or consolidation. Electronically Signed   By: Bretta Bang III M.D.   On: 06/16/2017 19:42  ASSESSMENT AND PLAN:   Active Problems:   Wolff-Parkinson-White (WPW) syndrome   #1. chest pain and palpitations due to atrial fibrillation with  RVR: Status post  synchronouscardioversion in the emergency room. Patient has history of WPW syndrome, had cardioversion multiple times. Says he stopped Xarelto before but stopped many years ago. On heparin drip right now. Pending cardiology evaluation. Patient told me that he wants to go home tonight. #2. Elevated troponins likely secondary to chest pain and cardioversion with atrial fibrillation. Continue heparin drip, cardiology consult pending, follow echocardiogram. #3 history of opiate use disorder: Continue Suboxone at discharge. #4. history of hypertension ; continue home dose lisinopril and hydrochlorothiazide.    All the records are reviewed and case discussed with Care Management/Social Workerr. Management plans discussed with the patient, family and they are in agreement.  CODE STATUS:full  TOTAL TIME TAKING CARE OF THIS PATIENT: 35 minutes.   POSSIBLE D/C IN 1-2 DAYS, DEPENDING ON CLINICAL CONDITION.   Katha Hamming M.D on 06/17/2017 at 9:36 AM  Between 7am to 6pm - Pager - [phone]  After 6pm go to [email or web address] - password [email or web address] [facility]  [facility]  Office  [phone]  CC: Primary care physician; Fayrene Helper, NP

## 2017-06-17 NOTE — Consult Note (Signed)
Randall Fields is a 41 y.o. male  [id number]  Primary Cardiologist: Adrian Blackwater Reason for Consultation: Atrial fibrillation with WPW  HPI: This is a 41 year old white male with a history of WPW and atrial fibrillation presented with atrial fibrillation with rapid ventricular response rate and needed electrical cardioversion in the emergency room currently in sinus rhythm and feeling much better.   Review of Systems: No chest pain but had palpitation and shortness of breath   Past Medical History:  Diagnosis Date  . Atrial fibrillation (HCC)   . Hypercholesteremia   . Hypertension   . Palpitation   . Sleep apnea   . Wolff-Parkinson-White syndrome     Medications Prior to Admission  Medication Sig Dispense Refill  . buprenorphine-naloxone (SUBOXONE) 8-2 mg SUBL SL tablet Place 0.5-1 tablets under the tongue daily. Takes half a strip in the morning and whole strip in the evening    . lisinopril-hydrochlorothiazide (PRINZIDE,ZESTORETIC) 10-12.5 MG tablet Take 1 tablet by mouth daily.       . buprenorphine-naloxone  1 tablet Sublingual Daily  . lisinopril  10 mg Oral Daily   And  . hydrochlorothiazide  12.5 mg Oral Daily  . Influenza vac split quadrivalent PF  0.5 mL Intramuscular Tomorrow-1000    Infusions: . heparin 1,450 Units/hr (06/17/17 1159)    Allergies  Allergen Reactions  . Ceclor [Cefaclor]     hives    Social History   Social History  . Marital status: Married    Spouse name: N/A  . Number of children: N/A  . Years of education: N/A   Occupational History  . Not on file.   Social History Main Topics  . Smoking status: Never Smoker  . Smokeless tobacco: Current User  . Alcohol use No  . Drug use: No  . Sexual activity: Not on file   Other Topics Concern  . Not on file   Social History Narrative  . No narrative on file    History reviewed. No pertinent family history.  PHYSICAL EXAM: Vitals:   06/17/17 0744 06/17/17 1052   BP: 125/67 (!) 142/76  Pulse: 62   Resp: 17   Temp: 98 F (36.7 C)   SpO2: 92%      Intake/Output Summary (Last 24 hours) at 06/17/17 1347 Last data filed at 06/17/17 4098  Gross per 24 hour  Intake           1621.7 ml  Output              550 ml  Net           1071.7 ml    General:  Well appearing. No respiratory difficulty HEENT: normal Neck: supple. no JVD. Carotids 2+ bilat; no bruits. No lymphadenopathy or thryomegaly appreciated. Cor: PMI nondisplaced. Regular rate & rhythm. No rubs, gallops or murmurs. Lungs: clear Abdomen: soft, nontender, nondistended. No hepatosplenomegaly. No bruits or masses. Good bowel sounds. Extremities: no cyanosis, clubbing, rash, edema Neuro: alert & oriented x 3, cranial nerves grossly intact. moves all 4 extremities w/o difficulty. Affect pleasant.  JXB:JYNWG rhythm with WPW and inferolateral ST depression which is unchanged from EKG in 2017  Results for orders placed or performed during the hospital encounter of 06/16/17 (from the past 24 hour(s))  CBC     Status: None   Collection Time: 06/16/17  6:33 PM  Result Value Ref Range   WBC 10.2 3.8 - 10.6 K/uL   RBC 5.27 4.40 - 5.90 MIL/uL  Hemoglobin 15.8 13.0 - 18.0 g/dL   HCT 16.1 09.6 - 04.5 %   MCV 86.3 80.0 - 100.0 fL   MCH 30.0 26.0 - 34.0 pg   MCHC 34.8 32.0 - 36.0 g/dL   RDW 40.9 81.1 - 91.4 %   Platelets 257 150 - 440 K/uL  Troponin I     Status: Abnormal   Collection Time: 06/16/17  6:33 PM  Result Value Ref Range   Troponin I 0.34 (HH) <0.03 ng/mL  Comprehensive metabolic panel     Status: Abnormal   Collection Time: 06/16/17  6:33 PM  Result Value Ref Range   Sodium 136 135 - 145 mmol/L   Potassium 4.2 3.5 - 5.1 mmol/L   Chloride 99 (L) 101 - 111 mmol/L   CO2 28 22 - 32 mmol/L   Glucose, Bld 106 (H) 65 - 99 mg/dL   BUN 20 6 - 20 mg/dL   Creatinine, Ser 7.82 0.61 - 1.24 mg/dL   Calcium 9.5 8.9 - 95.6 mg/dL   Total Protein 8.2 (H) 6.5 - 8.1 g/dL   Albumin 4.3 3.5  - 5.0 g/dL   AST 33 15 - 41 U/L   ALT 29 17 - 63 U/L   Alkaline Phosphatase 30 (L) 38 - 126 U/L   Total Bilirubin 0.7 0.3 - 1.2 mg/dL   GFR calc non Af Amer >60 >60 mL/min   GFR calc Af Amer >60 >60 mL/min   Anion gap 9 5 - 15  APTT     Status: None   Collection Time: 06/16/17  8:01 PM  Result Value Ref Range   aPTT 28 24 - 36 seconds  Protime-INR     Status: None   Collection Time: 06/16/17  8:01 PM  Result Value Ref Range   Prothrombin Time 12.7 11.4 - 15.2 seconds   INR 0.96   Heparin level (unfractionated)     Status: Abnormal   Collection Time: 06/16/17  8:01 PM  Result Value Ref Range   Heparin Unfractionated <0.10 (L) 0.30 - 0.70 IU/mL  Troponin I     Status: Abnormal   Collection Time: 06/17/17 12:06 AM  Result Value Ref Range   Troponin I 0.29 (HH) <0.03 ng/mL  Heparin level (unfractionated)     Status: Abnormal   Collection Time: 06/17/17  3:15 AM  Result Value Ref Range   Heparin Unfractionated 0.28 (L) 0.30 - 0.70 IU/mL  Magnesium     Status: None   Collection Time: 06/17/17  3:15 AM  Result Value Ref Range   Magnesium 1.8 1.7 - 2.4 mg/dL  Phosphorus     Status: None   Collection Time: 06/17/17  3:15 AM  Result Value Ref Range   Phosphorus 3.8 2.5 - 4.6 mg/dL  Basic metabolic panel     Status: Abnormal   Collection Time: 06/17/17  3:15 AM  Result Value Ref Range   Sodium 135 135 - 145 mmol/L   Potassium 4.3 3.5 - 5.1 mmol/L   Chloride 102 101 - 111 mmol/L   CO2 27 22 - 32 mmol/L   Glucose, Bld 105 (H) 65 - 99 mg/dL   BUN 20 6 - 20 mg/dL   Creatinine, Ser 2.13 0.61 - 1.24 mg/dL   Calcium 8.7 (L) 8.9 - 10.3 mg/dL   GFR calc non Af Amer >60 >60 mL/min   GFR calc Af Amer >60 >60 mL/min   Anion gap 6 5 - 15  CBC     Status: None  Collection Time: 06/17/17  3:15 AM  Result Value Ref Range   WBC 9.7 3.8 - 10.6 K/uL   RBC 4.70 4.40 - 5.90 MIL/uL   Hemoglobin 14.3 13.0 - 18.0 g/dL   HCT 53.6 64.4 - 03.4 %   MCV 86.5 80.0 - 100.0 fL   MCH 30.4 26.0 - 34.0  pg   MCHC 35.1 32.0 - 36.0 g/dL   RDW 74.2 59.5 - 63.8 %   Platelets 232 150 - 440 K/uL  TSH     Status: None   Collection Time: 06/17/17  3:15 AM  Result Value Ref Range   TSH 2.331 0.350 - 4.500 uIU/mL  Lipid panel     Status: Abnormal   Collection Time: 06/17/17  3:15 AM  Result Value Ref Range   Cholesterol 167 0 - 200 mg/dL   Triglycerides 756 (H) <150 mg/dL   HDL 27 (L) >43 mg/dL   Total CHOL/HDL Ratio 6.2 RATIO   VLDL 68 (H) 0 - 40 mg/dL   LDL Cholesterol 72 0 - 99 mg/dL  Troponin I     Status: Abnormal   Collection Time: 06/17/17  6:20 AM  Result Value Ref Range   Troponin I 0.23 (HH) <0.03 ng/mL  Heparin level (unfractionated)     Status: None   Collection Time: 06/17/17  8:50 AM  Result Value Ref Range   Heparin Unfractionated 0.43 0.30 - 0.70 IU/mL  Troponin I     Status: Abnormal   Collection Time: 06/17/17 12:41 PM  Result Value Ref Range   Troponin I 0.17 (HH) <0.03 ng/mL   Dg Chest 2 View  Result Date: 06/16/2017 CLINICAL DATA:  Chest pain. History of Wolff Parkinson White syndrome EXAM: CHEST  2 VIEW COMPARISON:  November 16, 2008 FINDINGS: There is no edema or consolidation. The heart size and pulmonary vascularity are normal. No adenopathy. No pneumothorax. No bone lesions. IMPRESSION: No edema or consolidation. Electronically Signed   By: Bretta Bang III M.D.   On: 06/16/2017 19:42     ASSESSMENT AND PLAN:WPW with recurrent atrial fibrillation requiring electrical cardioversion and noncompliance with medication. Had a long discussion with the patient about ablation of the WPW bypass tract and patient has agreed to have this done and will set up at [facility] tomorrow. He has elevated troponin but has history of normal coronaries in the past by CTA coronaries will locate the records to confirm that in the office tomorrow.  Perris Tripathi A

## 2017-06-17 NOTE — Progress Notes (Signed)
Pt going down for ECHO

## 2017-06-17 NOTE — Progress Notes (Signed)
ANTICOAGULATION CONSULT NOTE - FOLLOW UP Consult  Pharmacy Consult for Heparin Drip dosing and monitoring  Indication: chest pain/ACS  Allergies  Allergen Reactions  . Ceclor [Cefaclor]     hives    Patient Measurements: Height:  (188 cm) Weight: 230 lb (104.3 kg) IBW/kg (Calculated) : 82.2  Vital Signs: Temp: 98 F (36.7 C) (09/16 0744) BP: 142/76 (09/16 1052) Pulse Rate: 62 (09/16 0744)  Labs:  Recent Labs  06/16/17 1833  06/16/17 2001 06/17/17 0006 06/17/17 0315 06/17/17 0620 06/17/17 0850 06/17/17 1241 06/17/17 1647  HGB 15.8  --   --   --  14.3  --   --   --   --   HCT 45.5  --   --   --  40.6  --   --   --   --   PLT 257  --   --   --  232  --   --   --   --   APTT  --   --  28  --   --   --   --   --   --   LABPROT  --   --  12.7  --   --   --   --   --   --   INR  --   --  0.96  --   --   --   --   --   --   HEPARINUNFRC  --   < > <0.10*  --  0.28*  --  0.43  --  0.26*  CREATININE 1.05  --   --   --  0.86  --   --   --   --   TROPONINI 0.34*  --   --  0.29*  --  0.23*  --  0.17*  --   < > = values in this interval not displayed.  Estimated Creatinine Clearance: 148.4 mL/min (by C-G formula based on SCr of 0.86 mg/dL).   Medical History: Past Medical History:  Diagnosis Date  . Atrial fibrillation (HCC)   . Hypercholesteremia   . Hypertension   . Palpitation   . Sleep apnea   . Wolff-Parkinson-White syndrome     Assessment: Pharmacy consulted for heparin dosing and monitoring in a 41 yo male for ACS/Chest Pain.   Goal of Therapy:  Heparin level 0.3-0.7 units/ml Monitor platelets by anticoagulation protocol: Yes   Plan:  Baseline labs ordered  Give 4000 units bolus x 1 Start heparin infusion at 1250 units/hr Check anti-Xa level in 6 hours and daily while on heparin Continue to monitor H&H and platelets   9/16 @ 0300 HL 0.28 subtherapeutic. Will rebolus w/ heparin 1500 units IV x 1 and will increase rate to 1450 units/hr and will  recheck HL @ 0900.  9/16: 0850 @ 0.43. Will continue current rate of heparin gtt. Will recheck Heparin level @ 1500.   9/16 @ 1730 HL 0.26 subtherapeutic. Will give bolus of 1500 units and increase heparin drip rate to 1600 units/hr. Will recheck HL 9/17 @ 0000.   Yolanda Bonine, PharmD Pharmacy Resident  06/17/2017 5:31 PM

## 2017-06-17 NOTE — Progress Notes (Signed)
PT Cancellation Note  Patient Details Name: OSEAS DETTY MRN: [id number] DOB: January 29, 1976   Cancelled Treatment:    Reason Eval/Treat Not Completed: Medical issues which prohibited therapy  Patient has WPW with recurrent atrial fibrillation requiring electrical cardioversion and noncompliance with medication. MD reports having  a long discussion with the patient about ablation of the WPW bypass tract and patient has agreed to have this done and will set up at [facility] tomorrow.   Jones Broom S,PT,DPT 06/17/2017, 3:21 PM

## 2017-06-17 NOTE — Progress Notes (Signed)
ANTICOAGULATION CONSULT NOTE - FOLLOW UP Consult  Pharmacy Consult for Heparin Drip dosing and monitoring  Indication: chest pain/ACS  Allergies  Allergen Reactions  . Ceclor [Cefaclor]     hives    Patient Measurements: Height:  (188 cm) Weight: 230 lb (104.3 kg) IBW/kg (Calculated) : 82.2  Vital Signs: Temp: 98 F (36.7 C) (09/16 0744) Temp Source: Oral (09/16 0458) BP: 125/67 (09/16 0744) Pulse Rate: 62 (09/16 0744)  Labs:  Recent Labs  06/16/17 1833 06/16/17 2001 06/17/17 0006 06/17/17 0315 06/17/17 0620 06/17/17 0850  HGB 15.8  --   --  14.3  --   --   HCT 45.5  --   --  40.6  --   --   PLT 257  --   --  232  --   --   APTT  --  28  --   --   --   --   LABPROT  --  12.7  --   --   --   --   INR  --  0.96  --   --   --   --   HEPARINUNFRC  --  <0.10*  --  0.28*  --  0.43  CREATININE 1.05  --   --  0.86  --   --   TROPONINI 0.34*  --  0.29*  --  0.23*  --     Estimated Creatinine Clearance: 148.4 mL/min (by C-G formula based on SCr of 0.86 mg/dL).   Medical History: Past Medical History:  Diagnosis Date  . Atrial fibrillation (HCC)   . Hypercholesteremia   . Hypertension   . Palpitation   . Sleep apnea   . Wolff-Parkinson-White syndrome     Assessment: Pharmacy consulted for heparin dosing and monitoring in a 41 yo male for ACS/Chest Pain.   Goal of Therapy:  Heparin level 0.3-0.7 units/ml Monitor platelets by anticoagulation protocol: Yes   Plan:  Baseline labs ordered  Give 4000 units bolus x 1 Start heparin infusion at 1250 units/hr Check anti-Xa level in 6 hours and daily while on heparin Continue to monitor H&H and platelets   9/16 @ 0300 HL 0.28 subtherapeutic. Will rebolus w/ heparin 1500 units IV x 1 and will increase rate to 1450 units/hr and will recheck HL @ 0900.  9/16: 0850 @ 0.43. Will continue current rate of heparin gtt. Will recheck Heparin level @ 1500.  Demetrius Charity, PharmD, BCPS Clinical  Pharmacist 06/17/2017 10:41 AM

## 2017-06-17 NOTE — Discharge Summary (Signed)
Randall Fields, is a 41 y.o. male  DOB 1976/02/15  MRN [id number].  Admission date:  06/16/2017  Admitting Physician  Tonye Royalty, DO  Discharge Date:  06/17/2017   Primary MD  Fayrene Helper, NP  Recommendations for primary care physician for things to follow:   Transfer to [facility]  When the bed is available.   Admission Diagnosis  Paroxysmal atrial fibrillation (HCC) [I48.0] WPW (Wolff-Parkinson-White syndrome) [I45.6] Chest pain, unspecified type [R07.9] Wolff-Parkinson-White (WPW) syndrome [I45.6]   Discharge Diagnosis  Paroxysmal atrial fibrillation (HCC) [I48.0] WPW (Wolff-Parkinson-White syndrome) [I45.6] Chest pain, unspecified type [R07.9] Wolff-Parkinson-White (WPW) syndrome [I45.6]    Active Problems:   Wolff-Parkinson-White (WPW) syndrome      Past Medical History:  Diagnosis Date  . Atrial fibrillation (HCC)   . Hypercholesteremia   . Hypertension   . Palpitation   . Sleep apnea   . Wolff-Parkinson-White syndrome     Past Surgical History:  Procedure Laterality Date  . ELECTROPHYSIOLOGIC STUDY N/A 01/18/2016   Procedure: CARDIOVERSION;  Surgeon: Laurier Nancy, MD;  Location: [facility] ORS;  Service: Cardiovascular;  Laterality: N/A;  . TEE WITHOUT CARDIOVERSION N/A 01/18/2016   Procedure: TRANSESOPHAGEAL ECHOCARDIOGRAM (TEE);  Surgeon: Laurier Nancy, MD;  Location: [facility] ORS;  Service: Cardiovascular;  Laterality: N/A;       History of present illness and  Hospital Course:     Kindly see H&P for history of present illness and admission details, please review complete Labs, Consult reports and Test reports for all details in brief  HPI  from the history and physical done on the day of admission This is a 41 year old white male with a history of WPW and atrial fibrillation presented  with atrial fibrillation with rapid ventricular response rate and needed electrical cardioversion in the emergency room and admitted to telemetry.   Hospital Course  Chest pain due to atrial fibrillation requiring electrical cardioversion and non compliance with medicines.admitted to tele,first troponin is 0.23,second one 0.17.chest pain is resolved.started on heparin drip.seen by Dr.Shaukat khan from cardio.he recommends transfer to [facility] for ablation of WPW tract.pt agreed for it.pt will go to [facility] when arrangements are made.     Discharge Condition:stable   Follow UP      Discharge Instructions  and  Discharge Medications      Allergies as of 06/17/2017      Reactions   Ceclor [cefaclor]    hives      Medication List    TAKE these medications   buprenorphine-naloxone 8-2 mg Subl SL tablet Commonly known as:  SUBOXONE Place 0.5-1 tablets under the tongue daily. Takes half a strip in the morning and whole strip in the evening   lisinopril-hydrochlorothiazide 10-12.5 MG tablet Commonly known as:  PRINZIDE,ZESTORETIC Take 1 tablet by mouth daily.         Diet and Activity recommendation: See Discharge Instructions above   Consults obtained - cardiology   Major procedures and Radiology Reports - PLEASE review detailed and final reports for all details, in brief -      Dg Chest 2 View  Result Date: 06/16/2017 CLINICAL DATA:  Chest pain. History of Wolff Parkinson White syndrome EXAM: CHEST  2 VIEW COMPARISON:  November 16, 2008 FINDINGS: There is no edema or consolidation. The heart size and pulmonary vascularity are normal. No adenopathy. No pneumothorax. No bone lesions. IMPRESSION: No edema or consolidation. Electronically Signed   By: Bretta Bang III M.D.   On: 06/16/2017 19:42  Micro Results    No results found for this or any previous visit (from the past 240 hour(s)).     Today   Subjective:   pt is stable for transfer to [facility] when the  arrangements are made,  Objective:   Blood pressure (!) 142/76, pulse 62, temperature 98 F (36.7 C), resp. rate 17, height 6\' 2"  (1.88 m), weight 104.3 kg (230 lb), SpO2 92 %.   Intake/Output Summary (Last 24 hours) at 06/17/17 1456 Last data filed at 06/17/17 1610  Gross per 24 hour  Intake           1621.7 ml  Output              550 ml  Net           1071.7 ml    Exam Awake Alert, Oriented x 3, No new F.N deficits, Normal affect [place].AT,PERRAL Supple Neck,No JVD, No cervical lymphadenopathy appriciated.  Symmetrical Chest wall movement, Good air movement bilaterally, CTAB RRR,No Gallops,Rubs or new Murmurs, No Parasternal Heave +ve B.Sounds, Abd Soft, Non tender, No organomegaly appriciated, No rebound -guarding or rigidity. No Cyanosis, Clubbing or edema, No new Rash or bruise  Data Review   CBC w Diff: Lab Results  Component Value Date   WBC 9.7 06/17/2017   HGB 14.3 06/17/2017   HCT 40.6 06/17/2017   PLT 232 06/17/2017    CMP: Lab Results  Component Value Date   NA 135 06/17/2017   K 4.3 06/17/2017   CL 102 06/17/2017   CO2 27 06/17/2017   BUN 20 06/17/2017   CREATININE 0.86 06/17/2017   PROT 8.2 (H) 06/16/2017   ALBUMIN 4.3 06/16/2017   BILITOT 0.7 06/16/2017   ALKPHOS 30 (L) 06/16/2017   AST 33 06/16/2017   ALT 29 06/16/2017  .   Total Time in preparing paper work, data evaluation and todays exam - 35 minutes  Anberlin Diez M.D on 06/17/2017 at 2:56 PM    Note: This dictation was prepared with Dragon dictation along with smaller phrase technology. Any transcriptional errors that result from this process are unintentional.

## 2017-06-17 NOTE — Progress Notes (Signed)
ANTICOAGULATION CONSULT NOTE - Initial Consult  Pharmacy Consult for Heparin Drip dosing and monitoring  Indication: chest pain/ACS  Allergies  Allergen Reactions  . Ceclor [Cefaclor]     hives    Patient Measurements: Height:  (188 cm) Weight: 230 lb (104.3 kg) IBW/kg (Calculated) : 82.2  Vital Signs: Temp: 97.9 F (36.6 C) (09/15 2316) Temp Source: Oral (09/15 2316) BP: 123/61 (09/15 2316) Pulse Rate: 60 (09/15 2316)  Labs:  Recent Labs  06/16/17 1833 06/16/17 2001 06/17/17 0006 06/17/17 0315  HGB 15.8  --   --  14.3  HCT 45.5  --   --  40.6  PLT 257  --   --  232  APTT  --  28  --   --   LABPROT  --  12.7  --   --   INR  --  0.96  --   --   HEPARINUNFRC  --  <0.10*  --  0.28*  CREATININE 1.05  --   --  0.86  TROPONINI 0.34*  --  0.29*  --     Estimated Creatinine Clearance: 148.4 mL/min (by C-G formula based on SCr of 0.86 mg/dL).   Medical History: Past Medical History:  Diagnosis Date  . Atrial fibrillation (HCC)   . Hypercholesteremia   . Hypertension   . Palpitation   . Sleep apnea   . Wolff-Parkinson-White syndrome     Assessment: Pharmacy consulted for heparin dosing and monitoring in a 41 yo male for ACS/Chest Pain.   Goal of Therapy:  Heparin level 0.3-0.7 units/ml Monitor platelets by anticoagulation protocol: Yes   Plan:  Baseline labs ordered  Give 4000 units bolus x 1 Start heparin infusion at 1250 units/hr Check anti-Xa level in 6 hours and daily while on heparin Continue to monitor H&H and platelets   9/16 @ 0300 HL 0.28 subtherapeutic. Will rebolus w/ heparin 1500 units IV x 1 and will increase rate to 1450 units/hr and will recheck HL @ 0900.  Thomasene Ripple, PharmD, BCPS Clinical Pharmacist 06/17/2017 4:56 AM

## 2017-06-18 DIAGNOSIS — E785 Hyperlipidemia, unspecified: Secondary | ICD-10-CM | POA: Diagnosis present

## 2017-06-18 DIAGNOSIS — E7800 Pure hypercholesterolemia, unspecified: Secondary | ICD-10-CM | POA: Diagnosis present

## 2017-06-18 DIAGNOSIS — I482 Chronic atrial fibrillation: Secondary | ICD-10-CM | POA: Diagnosis present

## 2017-06-18 DIAGNOSIS — R079 Chest pain, unspecified: Secondary | ICD-10-CM | POA: Diagnosis present

## 2017-06-18 DIAGNOSIS — Z9119 Patient's noncompliance with other medical treatment and regimen: Secondary | ICD-10-CM | POA: Diagnosis not present

## 2017-06-18 DIAGNOSIS — I456 Pre-excitation syndrome: Secondary | ICD-10-CM | POA: Diagnosis present

## 2017-06-18 DIAGNOSIS — Z888 Allergy status to other drugs, medicaments and biological substances status: Secondary | ICD-10-CM | POA: Diagnosis not present

## 2017-06-18 DIAGNOSIS — G4733 Obstructive sleep apnea (adult) (pediatric): Secondary | ICD-10-CM | POA: Diagnosis present

## 2017-06-18 DIAGNOSIS — Z9114 Patient's other noncompliance with medication regimen: Secondary | ICD-10-CM | POA: Diagnosis not present

## 2017-06-18 DIAGNOSIS — I10 Essential (primary) hypertension: Secondary | ICD-10-CM | POA: Diagnosis present

## 2017-06-18 DIAGNOSIS — I480 Paroxysmal atrial fibrillation: Secondary | ICD-10-CM | POA: Diagnosis present

## 2017-06-18 LAB — HEPARIN LEVEL (UNFRACTIONATED)
HEPARIN UNFRACTIONATED: 0.61 [IU]/mL (ref 0.30–0.70)
Heparin Unfractionated: 0.59 IU/mL (ref 0.30–0.70)

## 2017-06-18 MED ORDER — RIVAROXABAN 20 MG PO TABS: 20.0000 mg | ORAL_TABLET | Freq: Every day | ORAL | 0 refills | Status: DC

## 2017-06-18 NOTE — Care Management (Signed)
For transfer to [facility] for treatment of wolf parkinson white syndrome.

## 2017-06-18 NOTE — Progress Notes (Signed)
SUBJECTIVE: Pt is feeling well, no chest pain or palpitations.   Vitals:   06/17/17 0744 06/17/17 1052 06/18/17 0510 06/18/17 0850  BP: 125/67 (!) 142/76 108/63 127/61  Pulse: 62  (!) 51 (!) 56  Resp: Temp: 98 F (36.7 C)  98 F (36.7 C)   TempSrc:   Oral   SpO2: 92%  97% 100%  Weight:      Height:        Intake/Output Summary (Last 24 hours) at 06/18/17 0930 Last data filed at 06/18/17 1610  Gross per 24 hour  Intake              304 ml  Output             2350 ml  Net            -2046 ml    LABS: Basic Metabolic Panel:  Recent Labs  96/04/54 1833 06/17/17 0315  NA 136 135  K 4.2 4.3  CL 99* 102  CO2 28 27  GLUCOSE 106* 105*  BUN 20 20  CREATININE 1.05 0.86  CALCIUM 9.5 8.7*  MG  --  1.8  PHOS  --  3.8   Liver Function Tests:  Recent Labs  06/16/17 1833  AST 33  ALT 29  ALKPHOS 30*  BILITOT 0.7  PROT 8.2*  ALBUMIN 4.3   No results for input(s): LIPASE, AMYLASE in the last 72 hours. CBC:  Recent Labs  06/16/17 1833 06/17/17 0315  WBC 10.2 9.7  HGB 15.8 14.3  HCT 45.5 40.6  MCV 86.3 86.5  PLT 257 232   Cardiac Enzymes:  Recent Labs  06/17/17 0006 06/17/17 0620 06/17/17 1241  TROPONINI 0.29* 0.23* 0.17*   BNP: Invalid input(s): POCBNP D-Dimer: No results for input(s): DDIMER in the last 72 hours. Hemoglobin A1C: No results for input(s): HGBA1C in the last 72 hours. Fasting Lipid Panel:  Recent Labs  06/17/17 0315  CHOL 167  HDL 27*  LDLCALC 72  TRIG 098*  CHOLHDL 6.2   Thyroid Function Tests:  Recent Labs  06/17/17 0315  TSH 2.331   Anemia Panel: No results for input(s): VITAMINB12, FOLATE, FERRITIN, TIBC, IRON, RETICCTPCT in the last 72 hours.   PHYSICAL EXAM General: Well developed, well nourished, in no acute distress HEENT:  Normocephalic and atramatic Neck:  No JVD.  Lungs: Clear bilaterally to auscultation and percussion. Heart: HRRR . Normal S1 and S2 without gallops or murmurs.  Abdomen:  Bowel sounds are positive, abdomen soft and non-tender  Msk:  Back normal, normal gait. Normal strength and tone for age. Extremities: No clubbing, cyanosis or edema.   Neuro: Alert and oriented X 3. Psych:  Good affect, responds appropriately  TELEMETRY: NSR Wolff Parkinson White Syndome  ASSESSMENT AND PLAN: Plan to transfer to [facility] as soon as possible for ablation of the WPW bypass tract. History of normal coronaries on CCTA 01/2016:  1 - Calcium score is 19.6.  2 - Right dominant system.  3 - Minor luminor irregularities.  Pt is anxious to move forward with transfer or be discharged home and schedule a follow up with [facility]. Please let pt know plan for transfer. Thank you.     Active Problems:   Wolff-Parkinson-White (WPW) syndrome    Caroleen Hamman, NP-C 06/18/2017 9:30 AM

## 2017-06-18 NOTE — Progress Notes (Signed)
Randall Fields to be D/C'd Home per MD order.  Discussed prescriptions and follow up appointments with the patient. Prescriptions given to patient along with coupon provided by casemanager, medication list explained in detail. Pt verbalized understanding.  Allergies as of 06/18/2017      Reactions   Ceclor [cefaclor]    hives      Medication List    TAKE these medications   buprenorphine-naloxone 8-2 mg Subl SL tablet Commonly known as:  SUBOXONE Place 0.5-1 tablets under the tongue daily. Takes half a strip in the morning and whole strip in the evening   lisinopril-hydrochlorothiazide 10-12.5 MG tablet Commonly known as:  PRINZIDE,ZESTORETIC Take 1 tablet by mouth daily.   rivaroxaban 20 MG Tabs tablet Commonly known as:  XARELTO Take 1 tablet (20 mg total) by mouth daily with supper.            Discharge Care Instructions        Start     Ordered   06/18/17 0000  rivaroxaban (XARELTO) 20 MG TABS tablet  (rivaroxaban (XARELTO) for Nonvalvular Atrial Fibrillation)  Daily with supper     06/18/17 1106      Vitals:   06/18/17 0510 06/18/17 0850  BP: 108/63 127/61  Pulse: (!) 51 (!) 56  Resp: 18 17  Temp: 98 F (36.7 C)   SpO2: 97% 100%    Skin clean, dry and intact without evidence of skin break down, no evidence of skin tears noted. IV catheter discontinued intact. Site without signs and symptoms of complications. Dressing and pressure applied. Pt denies pain at this time. No complaints noted.  An After Visit Summary was printed and given to the patient. Patient escorted via WC, and D/C home via private auto.  Kaleb Linquist Marylou Flesher

## 2017-06-18 NOTE — Progress Notes (Signed)
Tamala from [facility] transfer center called and informed pt. Would not bed receiving a bed tonight, it would most likely be Monday 9/17 he would be transferred.

## 2017-06-18 NOTE — Care Management (Signed)
Xarelto is a chronic medication.  Patient says no one has ever given him a co pay assist" card for ongoing copays.  Provided patient with this resource

## 2017-06-18 NOTE — Progress Notes (Addendum)
ANTICOAGULATION CONSULT NOTE - FOLLOW UP Consult  Pharmacy Consult for Heparin Drip dosing and monitoring  Indication: chest pain/ACS  Allergies  Allergen Reactions  . Ceclor [Cefaclor]     hives    Patient Measurements: Height:  (188 cm) Weight: 230 lb (104.3 kg) IBW/kg (Calculated) : 82.2  Vital Signs: Temp: 98 F (36.7 C) (09/17 0510) Temp Source: Oral (09/17 0510) BP: 108/63 (09/17 0510) Pulse Rate: 51 (09/17 0510)  Labs:  Recent Labs  06/16/17 1833  06/16/17 2001 06/17/17 0006 06/17/17 0315 06/17/17 0620  06/17/17 1241 06/17/17 1647 06/18/17 0016 06/18/17 0602  HGB 15.8  --   --   --  14.3  --   --   --   --   --   --   HCT 45.5  --   --   --  40.6  --   --   --   --   --   --   PLT 257  --   --   --  232  --   --   --   --   --   --   APTT  --   --  28  --   --   --   --   --   --   --   --   LABPROT  --   --  12.7  --   --   --   --   --   --   --   --   INR  --   --  0.96  --   --   --   --   --   --   --   --   HEPARINUNFRC  --   < > <0.10*  --  0.28*  --   < >  --  0.26* 0.59 0.61  CREATININE 1.05  --   --   --  0.86  --   --   --   --   --   --   TROPONINI 0.34*  --   --  0.29*  --  0.23*  --  0.17*  --   --   --   < > = values in this interval not displayed.  Estimated Creatinine Clearance: 148.4 mL/min (by C-G formula based on SCr of 0.86 mg/dL).   Medical History: Past Medical History:  Diagnosis Date  . Atrial fibrillation (HCC)   . Hypercholesteremia   . Hypertension   . Palpitation   . Sleep apnea   . Wolff-Parkinson-White syndrome     Assessment: Pharmacy consulted for heparin dosing and monitoring in a 41 yo male for ACS/Chest Pain.   Goal of Therapy:  Heparin level 0.3-0.7 units/ml Monitor platelets by anticoagulation protocol: Yes   Plan:  Baseline labs ordered  Give 4000 units bolus x 1 Start heparin infusion at 1250 units/hr Check anti-Xa level in 6 hours and daily while on heparin Continue to monitor H&H and  platelets   9/16 @ 0300 HL 0.28 subtherapeutic. Will rebolus w/ heparin 1500 units IV x 1 and will increase rate to 1450 units/hr and will recheck HL @ 0900.  9/16: 0850 @ 0.43. Will continue current rate of heparin gtt. Will recheck Heparin level @ 1500.   9/16 @ 1730 HL 0.26 subtherapeutic. Will give bolus of 1500 units and increase heparin drip rate to 1600 units/hr. Will recheck HL 9/17 @ 0000.   9/17 @ 0600 HL 0.61 therapeutic. Will continue  current rate and will recheck next HL @ 1200.  9/17 10:08 - two heparin levels have been therapeutic at 1600 units/hr: (00:16 HL 0.59; 06:02 HL 0.61). Will switch to daily monitoring. Neal Trulson A. [place], [place].D., BCPS  Thomasene Ripple, PharmD, BCPS Clinical Pharmacist 06/18/2017

## 2017-06-18 NOTE — Discharge Summary (Signed)
Randall Fields, is a 41 y.o. male  DOB [birth year]  MRN [id number].  Admission date:  06/16/2017  Admitting Physician  Tonye Royalty, DO  Discharge Date:  06/18/2017   Primary MD  Fayrene Helper, NP  Recommendations for primary care physician for things to follow:   Patient is discharged home, he did not want to wait for [facility] transfer. So we discharged him home, follow-up with Dr. Adrian Blackwater as an outpatient   Admission Diagnosis  Paroxysmal atrial fibrillation (HCC) [I48.0] WPW (Wolff-Parkinson-White syndrome) [I45.6] Chest pain, unspecified type [R07.9] Wolff-Parkinson-White (WPW) syndrome [I45.6]   Discharge Diagnosis  Paroxysmal atrial fibrillation (HCC) [I48.0] WPW (Wolff-Parkinson-White syndrome) [I45.6] Chest pain, unspecified type [R07.9] Wolff-Parkinson-White (WPW) syndrome [I45.6]    Active Problems:   Wolff-Parkinson-White (WPW) syndrome      Past Medical History:  Diagnosis Date  . Atrial fibrillation (HCC)   . Hypercholesteremia   . Hypertension   . Palpitation   . Sleep apnea   . Wolff-Parkinson-White syndrome     Past Surgical History:  Procedure Laterality Date  . ELECTROPHYSIOLOGIC STUDY N/A 01/18/2016   Procedure: CARDIOVERSION;  Surgeon: Laurier Nancy, MD;  Location: [facility] ORS;  Service: Cardiovascular;  Laterality: N/A;  . TEE WITHOUT CARDIOVERSION N/A 01/18/2016   Procedure: TRANSESOPHAGEAL ECHOCARDIOGRAM (TEE);  Surgeon: Laurier Nancy, MD;  Location: [facility] ORS;  Service: Cardiovascular;  Laterality: N/A;       History of present illness and  Hospital Course:     Kindly see H&P for history of present illness and admission details, please review complete Labs, Consult reports and Test reports for all details in brief  HPI  from the history and physical done on the day of  admission This is a 41 year old white male with a history of WPW and atrial fibrillation presented with atrial fibrillation with rapid ventricular response rate and needed electrical cardioversion in the emergency room and admitted to telemetry.   Hospital Course  Chest pain due to atrial fibrillation requiring electrical cardioversion and non compliance with medicines.admitted to tele,first troponin is 0.23,second one 0.17.chest pain is resolved.started on heparin drip.seen by Dr.Shaukat khan from cardio.he recommends transfer to [facility] for ablation of WPW tract. Plan to transfer to [facility].but patient refused to go there and wanted to go home. Patient can go home, follow-up with Dr. Adrian Blackwater, follow-up with [facility] an outpatient for EP studies and also possible ablation of WPW tract..  2.chronic afib;cdischarged home with Xarelto as per cardiology recommendation.   #3 essential hypertension: Continue  lisinopril/HCTZ.  Discharge Condition:stable   Follow UP  Follow-up Information    Fayrene Helper, NP Follow up on 06/25/2017.   Specialty:  Nurse Practitioner Why:  appt at 10:45 Contact information: [address] [place] [address] [phone]             Discharge Instructions  and  Discharge Medications      Allergies as of 06/18/2017      Reactions   Ceclor [cefaclor]    hives      Medication List    TAKE these medications   buprenorphine-naloxone 8-2 mg Subl SL tablet Commonly known as:  SUBOXONE Place 0.5-1 tablets under the tongue daily. Takes half a strip in the morning and whole strip in the evening   lisinopril-hydrochlorothiazide 10-12.5 MG tablet Commonly known as:  PRINZIDE,ZESTORETIC Take 1 tablet by mouth daily.   rivaroxaban 20 MG Tabs tablet Commonly known as:  XARELTO Take 1 tablet (20 mg total)  by mouth daily with supper.            Discharge Care Instructions        Start     Ordered   06/18/17 0000  rivaroxaban (XARELTO) 20  MG TABS tablet  (rivaroxaban (XARELTO) for Nonvalvular Atrial Fibrillation)  Daily with supper     06/18/17 1106        Diet and Activity recommendation: See Discharge Instructions above   Consults obtained - cardiology   Major procedures and Radiology Reports - PLEASE review detailed and final reports for all details, in brief -      Dg Chest 2 View  Result Date: 06/16/2017 CLINICAL DATA:  Chest pain. History of Wolff Parkinson White syndrome EXAM: CHEST  2 VIEW COMPARISON:  November 16, 2008 FINDINGS: There is no edema or consolidation. The heart size and pulmonary vascularity are normal. No adenopathy. No pneumothorax. No bone lesions. IMPRESSION: No edema or consolidation. Electronically Signed   By: Bretta Bang III M.D.   On: 06/16/2017 19:42    Micro Results    No results found for this or any previous visit (from the past 240 hour(s)).     Today   Subjective:   pt is stable for transfer to [facility] when the arrangements are made,  Objective:   Blood pressure 127/61, pulse (!) 56, temperature 98 F (36.7 C), temperature source Oral, resp. rate 17, height 6\' 2"  (1.88 m), weight 104.3 kg (230 lb), SpO2 100 %.   Intake/Output Summary (Last 24 hours) at 06/18/17 1335 Last data filed at 06/18/17 1006  Gross per 24 hour  Intake              424 ml  Output             2350 ml  Net            -1926 ml    Exam Awake Alert, Oriented x 3, No new F.N deficits, Normal affect [place].AT,PERRAL Supple Neck,No JVD, No cervical lymphadenopathy appriciated.  Symmetrical Chest wall movement, Good air movement bilaterally, CTAB RRR,No Gallops,Rubs or new Murmurs, No Parasternal Heave +ve B.Sounds, Abd Soft, Non tender, No organomegaly appriciated, No rebound -guarding or rigidity. No Cyanosis, Clubbing or edema, No new Rash or bruise  Data Review   CBC w Diff:  Lab Results  Component Value Date   WBC 9.7 06/17/2017   HGB 14.3 06/17/2017   HCT 40.6 06/17/2017   PLT  232 06/17/2017    CMP:  Lab Results  Component Value Date   NA 135 06/17/2017   K 4.3 06/17/2017   CL 102 06/17/2017   CO2 27 06/17/2017   BUN 20 06/17/2017   CREATININE 0.86 06/17/2017   PROT 8.2 (H) 06/16/2017   ALBUMIN 4.3 06/16/2017   BILITOT 0.7 06/16/2017   ALKPHOS 30 (L) 06/16/2017   AST 33 06/16/2017   ALT 29 06/16/2017  .   Total Time in preparing paper work, data evaluation and todays exam - 35 minutes  Maurion Walkowiak M.D on 06/18/2017 at 1:35 PM    Note: This dictation was prepared with Dragon dictation along with smaller phrase technology. Any transcriptional errors that result from this process are unintentional.

## 2017-06-19 LAB — HIV ANTIBODY (ROUTINE TESTING W REFLEX): HIV Screen 4th Generation wRfx: NONREACTIVE

## 2017-07-07 ENCOUNTER — Encounter: Payer: Self-pay | Admitting: Emergency Medicine

## 2017-07-07 ENCOUNTER — Emergency Department
Admission: EM | Admit: 2017-07-07 | Discharge: 2017-07-07 | Disposition: A | Discharge status: Home / Self Care | Payer: Medicare HMO | Attending: Emergency Medicine | Admitting: Emergency Medicine

## 2017-07-07 ENCOUNTER — Emergency Department: Payer: Medicare HMO

## 2017-07-07 DIAGNOSIS — F17290 Nicotine dependence, other tobacco product, uncomplicated: Secondary | ICD-10-CM | POA: Insufficient documentation

## 2017-07-07 DIAGNOSIS — M546 Pain in thoracic spine: Secondary | ICD-10-CM | POA: Diagnosis not present

## 2017-07-07 DIAGNOSIS — Z7901 Long term (current) use of anticoagulants: Secondary | ICD-10-CM | POA: Diagnosis not present

## 2017-07-07 DIAGNOSIS — I10 Essential (primary) hypertension: Secondary | ICD-10-CM | POA: Diagnosis not present

## 2017-07-07 DIAGNOSIS — R3121 Asymptomatic microscopic hematuria: Secondary | ICD-10-CM | POA: Diagnosis not present

## 2017-07-07 DIAGNOSIS — Z79899 Other long term (current) drug therapy: Secondary | ICD-10-CM | POA: Insufficient documentation

## 2017-07-07 DIAGNOSIS — R1011 Right upper quadrant pain: Secondary | ICD-10-CM | POA: Diagnosis present

## 2017-07-07 LAB — CBC
HEMATOCRIT: 41.2 % (ref 40.0–52.0)
Hemoglobin: 14.4 g/dL (ref 13.0–18.0)
MCH: 29.8 pg (ref 26.0–34.0)
MCHC: 34.9 g/dL (ref 32.0–36.0)
MCV: 85.4 fL (ref 80.0–100.0)
PLATELETS: 226 10*3/uL (ref 150–440)
RBC: 4.83 MIL/uL (ref 4.40–5.90)
RDW: 12.9 % (ref 11.5–14.5)
WBC: 8.2 10*3/uL (ref 3.8–10.6)

## 2017-07-07 LAB — BASIC METABOLIC PANEL
Anion gap: 8 (ref 5–15)
BUN: 16 mg/dL (ref 6–20)
CO2: 28 mmol/L (ref 22–32)
Calcium: 9.4 mg/dL (ref 8.9–10.3)
Chloride: 104 mmol/L (ref 101–111)
Creatinine, Ser: 1.23 mg/dL (ref 0.61–1.24)
GFR calc Af Amer: >60 mL/min (ref >60)
GLUCOSE: 105 mg/dL — AB (ref 65–99)
POTASSIUM: 4.1 mmol/L (ref 3.5–5.1)
Sodium: 140 mmol/L (ref 135–145)

## 2017-07-07 LAB — URINALYSIS, COMPLETE (UACMP) WITH MICROSCOPIC
Bilirubin Urine: NEGATIVE
GLUCOSE, UA: NEGATIVE mg/dL
Ketones, ur: NEGATIVE mg/dL
LEUKOCYTES UA: NEGATIVE
NITRITE: NEGATIVE
PROTEIN: 100 mg/dL — AB
Specific Gravity, Urine: 1.023 (ref 1.005–1.030)
pH: 5 (ref 5.0–8.0)

## 2017-07-07 LAB — HEPATIC FUNCTION PANEL
ALBUMIN: 4 g/dL (ref 3.5–5.0)
ALT: 20 U/L (ref 17–63)
AST: 29 U/L (ref 15–41)
Alkaline Phosphatase: 33 U/L — ABNORMAL LOW (ref 38–126)
Bilirubin, Direct: 0.2 mg/dL (ref 0.1–0.5)
Indirect Bilirubin: 0.6 mg/dL (ref 0.3–0.9)
TOTAL PROTEIN: 7.6 g/dL (ref 6.5–8.1)
Total Bilirubin: 0.8 mg/dL (ref 0.3–1.2)

## 2017-07-07 LAB — LIPASE, BLOOD: Lipase: 55 U/L — ABNORMAL HIGH (ref 11–51)

## 2017-07-07 MED ORDER — KETOROLAC TROMETHAMINE 30 MG/ML IJ SOLN
30.0000 mg | Freq: Once | INTRAMUSCULAR | Status: AC
  Administered 2017-07-07: 30 mg via INTRAMUSCULAR
  Filled 2017-07-07: qty 1

## 2017-07-07 MED ORDER — NAPROXEN 500 MG PO TABS
500.0000 mg | ORAL_TABLET | Freq: Two times a day (BID) | ORAL | 2 refills | Status: DC
Start: 2017-07-07 — End: 2021-03-02

## 2017-07-07 NOTE — ED Triage Notes (Signed)
Pt reports right mid back and flank pain since Wednesday; when he woke this am to void pt says his urine "looked like black coffee"; denies dysuria or any trouble voiding; denies fever; ambulatory with steady gait; talking in complete coherent sentences

## 2017-07-07 NOTE — Discharge Instructions (Signed)
Please follow up with your doctor about your hematuria. This is likely related to Xarelto but needs to be evaluated further. It does not appear to be related to your back pain.

## 2017-07-07 NOTE — ED Provider Notes (Signed)
[facility] Emergency Department Provider Note   ____________________________________________    I have reviewed the triage vital signs and the nursing notes.   HISTORY  Chief Complaint Flank Pain     HPI Randall Fields is a 41 y.o. male With a history of atrial fibrillation on Xarelto who presents with right-sided flank pain which started proximally 4 hours ago. He describes as an aching pain. It is moderate in intensity but occasionally becomes worse depending on if he moves a certain direction. Patient reports he has a long history of slipped discs in his back.No fevers or chills. No dysuria, he has noted possible hematuria. No history of kidney stones.He has never had this pain before.Medical records demonstrates recent hospitalization for atrial fibrillation   Past Medical History:  Diagnosis Date  . Atrial fibrillation (HCC)   . Hypercholesteremia   . Hypertension   . Palpitation   . Sleep apnea   . Wolff-Parkinson-White syndrome     Patient Active Problem List   Diagnosis Date Noted  . Wolff-Parkinson-White (WPW) syndrome 06/16/2017    Past Surgical History:  Procedure Laterality Date  . ELECTROPHYSIOLOGIC STUDY N/A 01/18/2016   Procedure: CARDIOVERSION;  Surgeon: Laurier Nancy, MD;  Location: [facility] ORS;  Service: Cardiovascular;  Laterality: N/A;  . TEE WITHOUT CARDIOVERSION N/A 01/18/2016   Procedure: TRANSESOPHAGEAL ECHOCARDIOGRAM (TEE);  Surgeon: Laurier Nancy, MD;  Location: [facility] ORS;  Service: Cardiovascular;  Laterality: N/A;    Prior to Admission medications   Medication Sig Start Date End Date Taking? Authorizing Provider  buprenorphine-naloxone (SUBOXONE) 8-2 mg SUBL SL tablet Place 0.5-1 tablets under the tongue daily. Takes half a strip in the morning and whole strip in the evening   Yes [provider]  lisinopril-hydrochlorothiazide (PRINZIDE,ZESTORETIC) 10-12.5 MG tablet Take 1 tablet by mouth daily.   Yes  [provider]  rivaroxaban (XARELTO) 20 MG TABS tablet Take 1 tablet (20 mg total) by mouth daily with supper. 06/18/17  Yes Katha Hamming, MD  naproxen (NAPROSYN) 500 MG tablet Take 1 tablet (500 mg total) by mouth 2 (two) times daily with a meal. 07/07/17   Jene Every, MD     Allergies Ceclor [cefaclor]  History reviewed. No pertinent family history.  Social History Social History  Substance Use Topics  . Smoking status: Current Every Day Smoker    Types: E-cigarettes  . Smokeless tobacco: Current User    Types: Chew  . Alcohol use No    Review of Systems  Constitutional: No fever/chills Eyes: No visual changes.  ENT: No sore throat. Cardiovascular: Denies chest pain. Respiratory: Denies shortness of breath. Gastrointestinal: flank pain as above, no vomiting Genitourinary: Negative for dysuria. Musculoskeletal: chronic back pain Skin: Negative for rash. Neurological: Negative for focalweakness   ____________________________________________   PHYSICAL EXAM:  VITAL SIGNS: ED Triage Vitals  Enc Vitals Group     BP 07/07/17 0533 124/67     Pulse Rate 07/07/17 0533 61     Resp 07/07/17 0533 18     Temp 07/07/17 0533 98.2 F (36.8 C)     Temp Source 07/07/17 0533 Oral     SpO2 07/07/17 0533 99 %     Weight 07/07/17 0534 104.3 kg (230 lb)     Height 07/07/17 0534 1.88 m ( )     Head Circumference --      Peak Flow --      Pain Score 07/07/17 0533 6     Pain Loc --  Pain Edu? --      Excl. in GC? --     Constitutional: Alert and oriented. No acute distress. Pleasant and interactive Eyes: Conjunctivae are normal.   Nose: No congestion/rhinnorhea. Mouth/Throat: Mucous membranes are moist.   M Cardiovascular: Normal rate, regular rhythm. Grossly normal heart sounds.  Good peripheral circulation. Respiratory: Normal respiratory effort.  No retractions. Lungs CTAB. Gastrointestinal: Soft and nontender. No distention.  No CVA  tenderness. Genitourinary: deferred Musculoskeletal: back: Point tenderness to palpation right thoracic paraspinal area approximately T5, muscle spasm. Warm and well perfused extremities, normal strength Neurologic:  Normal speech and language. No gross focal neurologic deficits are appreciated.  Skin:  Skin is warm, dry and intact. No rash noted. Psychiatric: Mood and affect are normal. Speech and behavior are normal.  ____________________________________________   LABS (all labs ordered are listed, but only abnormal results are displayed)  Labs Reviewed  URINALYSIS, COMPLETE (UACMP) WITH MICROSCOPIC - Abnormal; Notable for the following:       Result Value   Color, Urine AMBER (*)    APPearance CLOUDY (*)    Hgb urine dipstick LARGE (*)    Protein, ur 100 (*)    Bacteria, UA RARE (*)    Squamous Epithelial / LPF 0-5 (*)    All other components within normal limits  BASIC METABOLIC PANEL - Abnormal; Notable for the following:    Glucose, Bld 105 (*)    All other components within normal limits  HEPATIC FUNCTION PANEL - Abnormal; Notable for the following:    Alkaline Phosphatase 33 (*)    All other components within normal limits  LIPASE, BLOOD - Abnormal; Notable for the following:    Lipase 55 (*)    All other components within normal limits  URINE CULTURE  CBC   ____________________________________________  EKG  None ____________________________________________  RADIOLOGY  CT renal stone study Unremarkable ____________________________________________   PROCEDURES  Procedure(s) performed: No    Critical Care performed: No ____________________________________________   INITIAL IMPRESSION / ASSESSMENT AND PLAN / ED COURSE  Pertinent labs & imaging results that were available during my care of the patient were reviewed by me and considered in my medical decision making (see chart for details).  Differential diagnosis includes ureterolithiasis, muscular  skeletal pain, radiculopathy.  Medical records reviewed, recent admission for atrial fibrillation does not appear related.  ----------------------------------------- 8:20 AM on 07/07/2017 -----------------------------------------  Patient's pain is resolved after Toradol. We'll continue treatment with anti-inflammatories. Have referred him to his PCP for further evaluation of hematuria, does not appear to be related to back pain which I think is muscular skeletal   ____________________________________________   FINAL CLINICAL IMPRESSION(S) / ED DIAGNOSES  Final diagnoses:  Acute right-sided thoracic back pain  Asymptomatic microscopic hematuria      NEW MEDICATIONS STARTED DURING THIS VISIT:  New Prescriptions   NAPROXEN (NAPROSYN) 500 MG TABLET    Take 1 tablet (500 mg total) by mouth 2 (two) times daily with a meal.     Note:  This document was prepared using Dragon voice recognition software and may include unintentional dictation errors.    Jene Every, MD 07/07/17 [phone]

## 2017-07-08 LAB — URINE CULTURE
CULTURE: NO GROWTH
SPECIAL REQUESTS: NORMAL

## 2018-03-08 IMAGING — CT CT RENAL STONE PROTOCOL
2 of 4 series · 16 of 46 positions shown, 18 images · non-contrast
Comparison: None.

CLINICAL DATA: Pt reports right mid back and flank pain since
[REDACTED]; when he woke this am to void pt says his urine "looked
like black coffee"; denies dysuria or any trouble voiding; denies
fever;

EXAM:
CT ABDOMEN AND PELVIS WITHOUT CONTRAST
TECHNIQUE: Multidetector CT imaging of the abdomen and pelvis was performed
following the standard protocol without IV contrast.

[Series 2: stone full standard · axial · 0.88mm/px · z∈[-815,-370]mm · 13 of 99 slices shown, 15 images]
[im 5/99  soft-tissue]
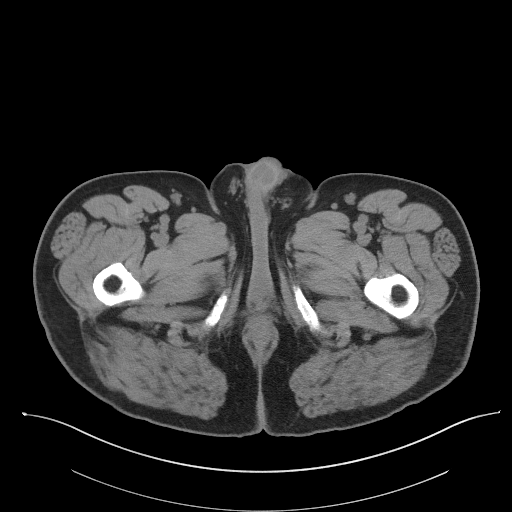
[im 5/99  bone]
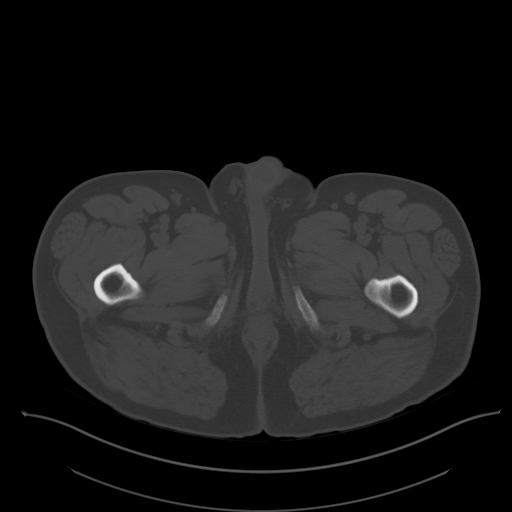
[im 13/99  soft-tissue]
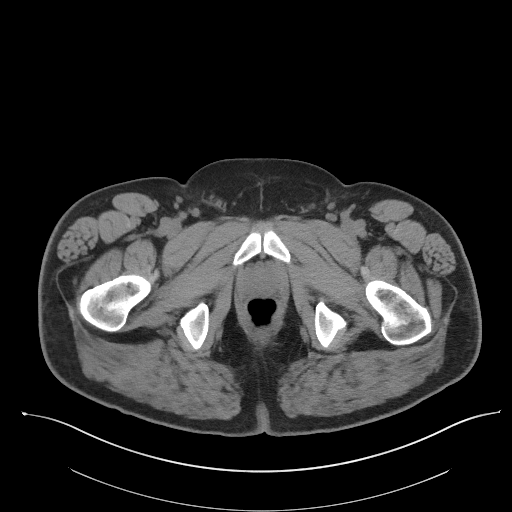
[im 21/99  soft-tissue]
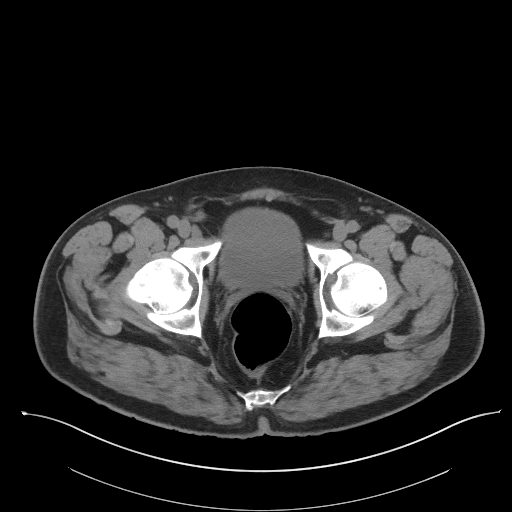
[im 29/99  soft-tissue]
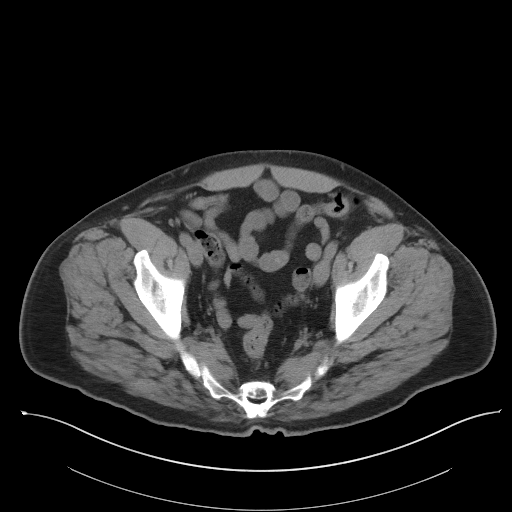
[im 33/99  soft-tissue]
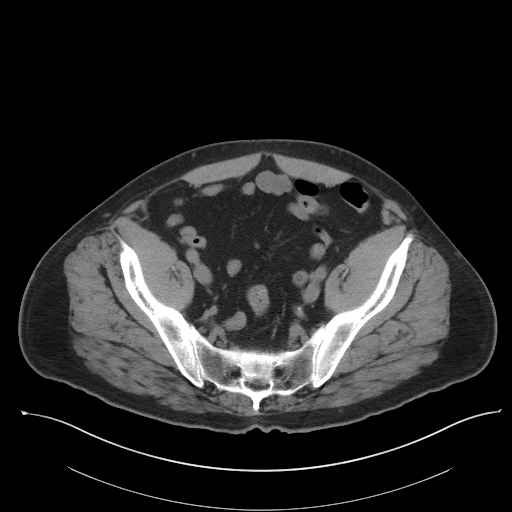
[im 41/99  soft-tissue]
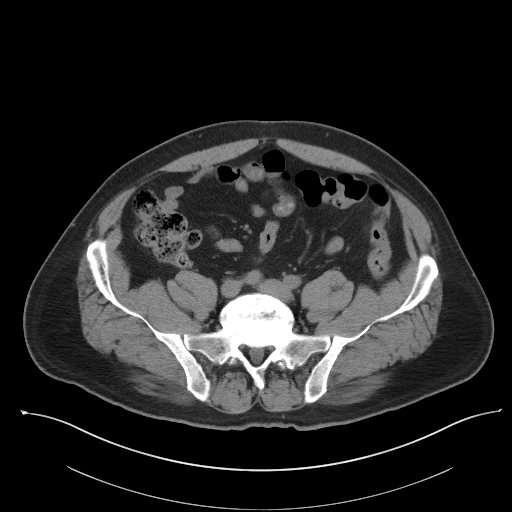
[im 50/99  soft-tissue]
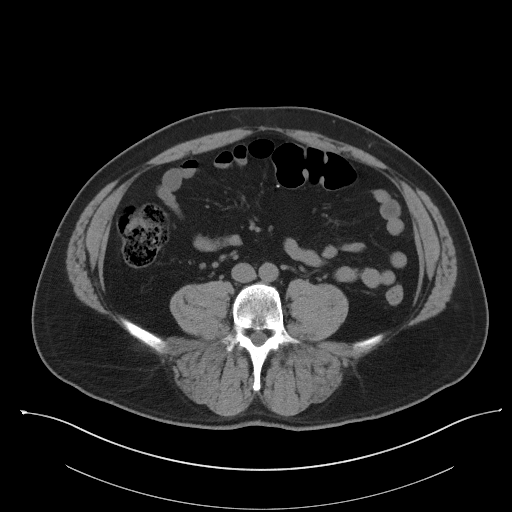
[im 58/99  soft-tissue]
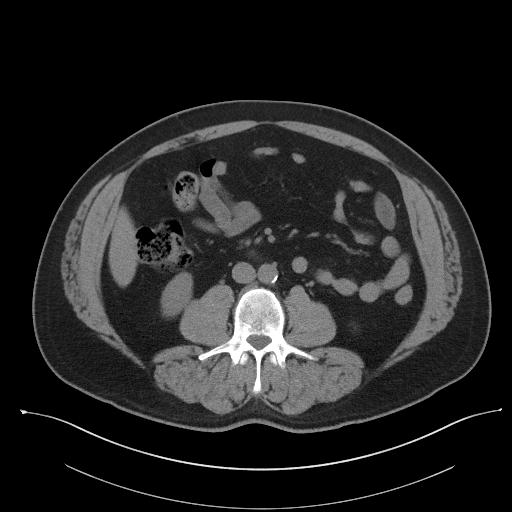
[im 66/99  soft-tissue]
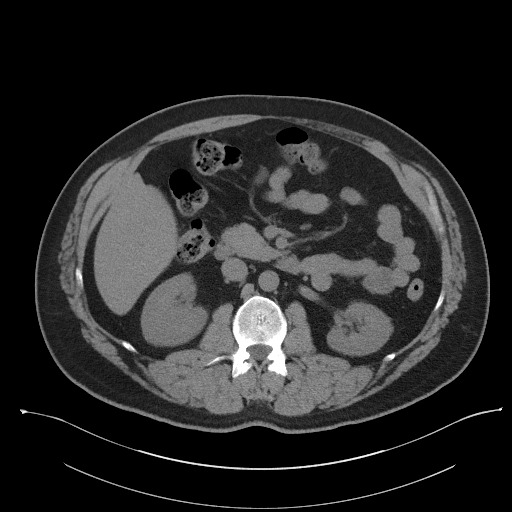
[im 66/99  bone]
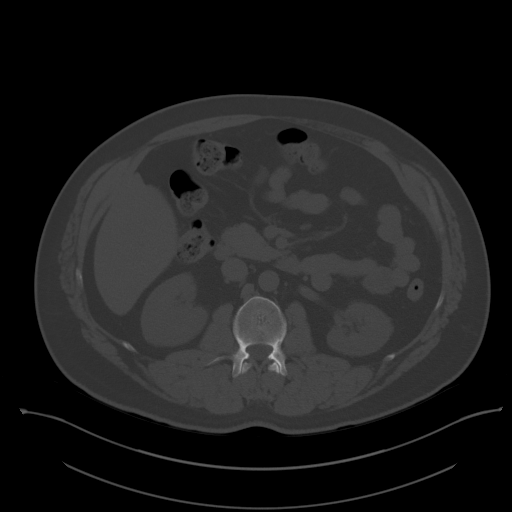
[im 70/99  soft-tissue]
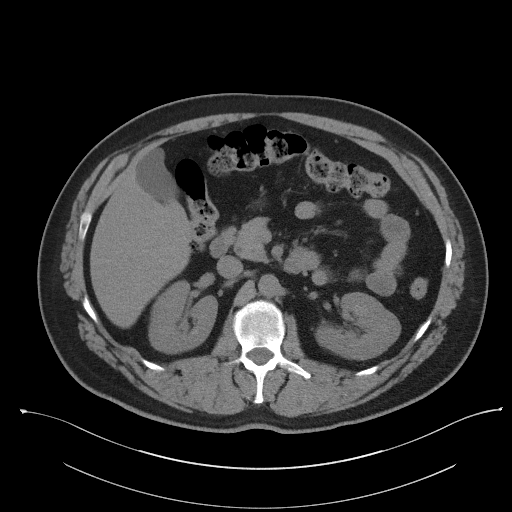
[im 78/99  soft-tissue]
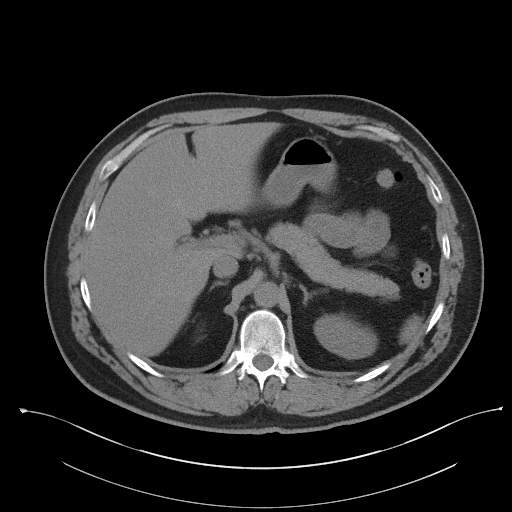
[im 86/99  soft-tissue]
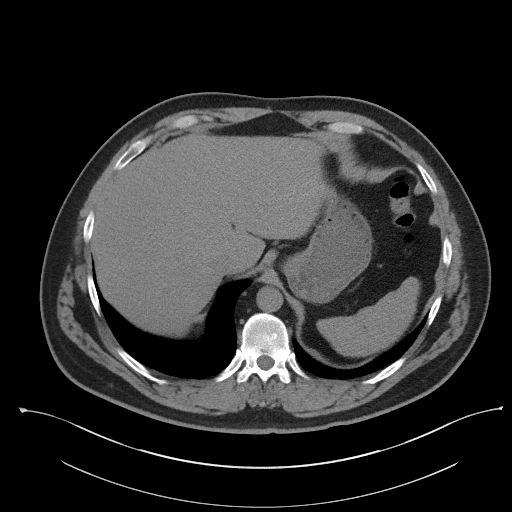
[im 94/99  soft-tissue]
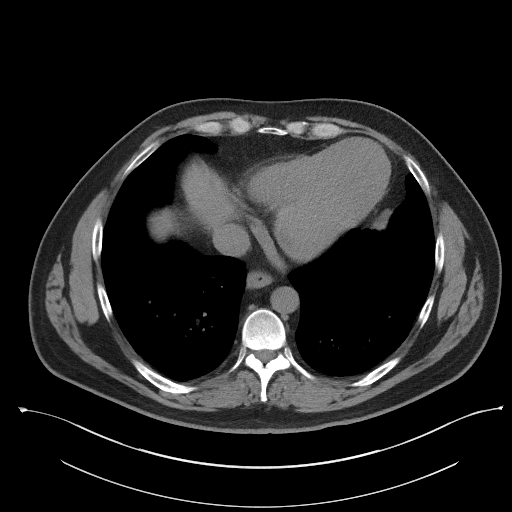

[Series 5: coronal · coronal · 0.83mm/px · 3 of 151 slices shown]
[im 51/151  soft-tissue]
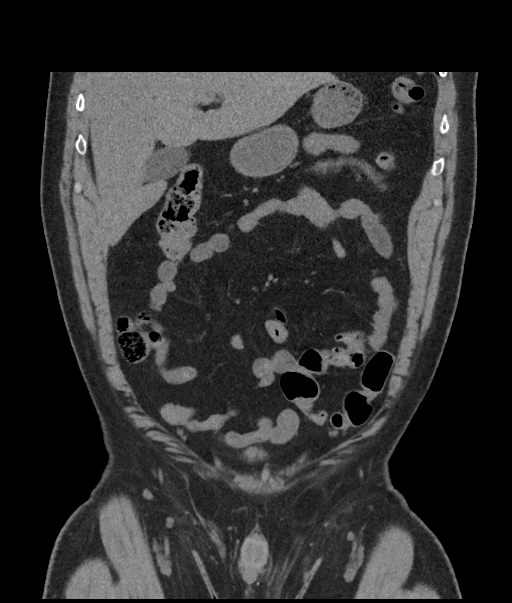
[im 67/151  soft-tissue]
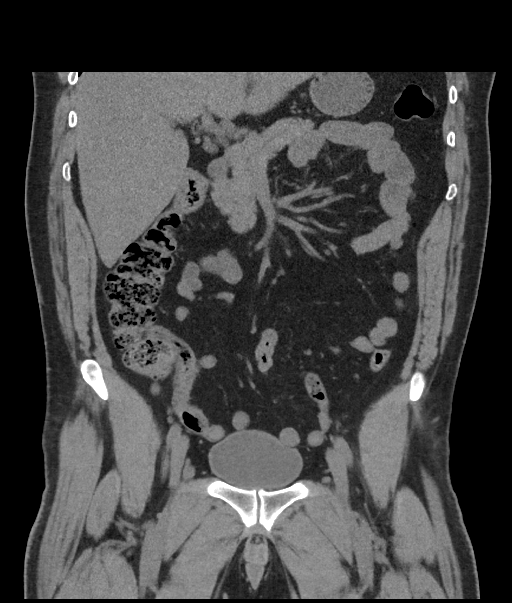
[im 84/151  soft-tissue]
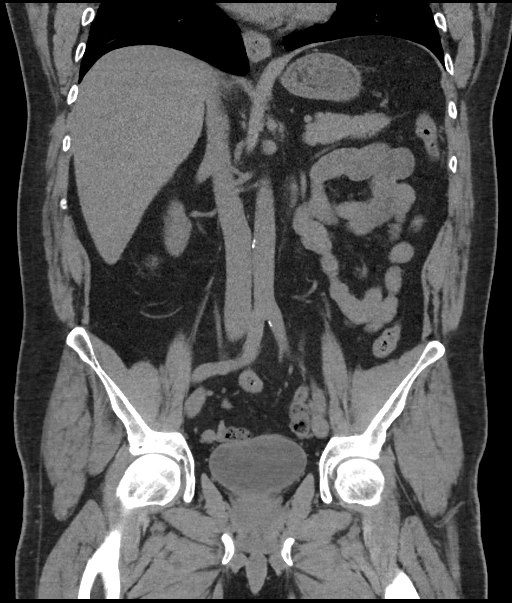

[16 of 46 positions shown; findings below may reference images not displayed]

FINDINGS: Lower chest: Clear lung bases. Heart is mildly enlarged. There is a
water density homogeneous oval mass within the left epicardial fat
measuring 3.4 x 1.6 cm, consistent with a pericardial cyst.

Hepatobiliary: Liver is normal in size. Diffusely decreased liver
attenuation consistent with fatty infiltration. No liver mass or
focal lesion. Normal gallbladder. No bile duct dilation.

Pancreas: Unremarkable. No pancreatic ductal dilatation or
surrounding inflammatory changes.

Spleen: Normal in size without focal abnormality.

Adrenals/Urinary Tract: Adrenal glands are unremarkable. Kidneys are
normal, without renal calculi, focal lesion, or hydronephrosis.
Bladder is unremarkable.

Stomach/Bowel: Stomach is within normal limits. Appendix appears
normal. No evidence of bowel wall thickening, distention, or
inflammatory changes.

Vascular/Lymphatic: There are few aortic atherosclerotic
calcifications. No other vascular abnormality. Several prominent
gastrohepatic ligament lymph nodes are noted, none pathologically
enlarged by size criteria.

Reproductive: Unremarkable.

Other: No abdominal wall hernia or abnormality. No abdominopelvic
ascites.

Musculoskeletal: No fracture or acute finding. No osteoblastic or
osteolytic lesions. Disc degenerative changes most evident at L5-S1
with a grade 1 anterolisthesis.
IMPRESSION: 1. No acute findings within the abdomen or pelvis. Specifically, no
renal or ureteral stones or obstructive uropathy. No findings to
account for the patient's symptoms.
2. Mild cardiomegaly.  3.4 cm pericardial cyst.
3. Hepatic steatosis.
4. Mild aortic atherosclerosis.

## 2018-07-09 ENCOUNTER — Emergency Department: Payer: Medicare HMO

## 2018-07-09 ENCOUNTER — Emergency Department
Admission: EM | Admit: 2018-07-09 | Discharge: 2018-07-09 | Disposition: A | Discharge status: Home / Self Care | Payer: Medicare HMO | Attending: Emergency Medicine | Admitting: Emergency Medicine

## 2018-07-09 ENCOUNTER — Other Ambulatory Visit: Payer: Self-pay

## 2018-07-09 DIAGNOSIS — I10 Essential (primary) hypertension: Secondary | ICD-10-CM | POA: Insufficient documentation

## 2018-07-09 DIAGNOSIS — F17210 Nicotine dependence, cigarettes, uncomplicated: Secondary | ICD-10-CM | POA: Insufficient documentation

## 2018-07-09 DIAGNOSIS — I4891 Unspecified atrial fibrillation: Secondary | ICD-10-CM | POA: Insufficient documentation

## 2018-07-09 DIAGNOSIS — G510 Bell's palsy: Secondary | ICD-10-CM | POA: Diagnosis not present

## 2018-07-09 DIAGNOSIS — R202 Paresthesia of skin: Secondary | ICD-10-CM | POA: Diagnosis present

## 2018-07-09 LAB — DIFFERENTIAL
BASOS ABS: 0 10*3/uL (ref 0–0.1)
Band Neutrophils: 0 %
Basophils Relative: 0 %
Blasts: 0 %
Eosinophils Absolute: 0.2 10*3/uL (ref 0–0.7)
Eosinophils Relative: 2 %
LYMPHS PCT: 63 %
Lymphs Abs: 5.8 10*3/uL — ABNORMAL HIGH (ref 1.0–3.6)
Metamyelocytes Relative: 0 %
Monocytes Absolute: 0.6 10*3/uL (ref 0.2–1.0)
Monocytes Relative: 7 %
Myelocytes: 0 %
NRBC: 0 /100{WBCs}
Neutro Abs: 2.5 10*3/uL (ref 1.4–6.5)
Neutrophils Relative %: 28 %
Other: 0 %
Promyelocytes Relative: 0 %

## 2018-07-09 LAB — CBC
HEMATOCRIT: 44.4 % (ref 40.0–52.0)
Hemoglobin: 15.3 g/dL (ref 13.0–18.0)
MCH: 29.4 pg (ref 26.0–34.0)
MCHC: 34.4 g/dL (ref 32.0–36.0)
MCV: 85.4 fL (ref 80.0–100.0)
PLATELETS: 253 10*3/uL (ref 150–440)
RBC: 5.2 MIL/uL (ref 4.40–5.90)
RDW: 13.3 % (ref 11.5–14.5)
WBC: 9.1 10*3/uL (ref 3.8–10.6)

## 2018-07-09 LAB — COMPREHENSIVE METABOLIC PANEL
ALBUMIN: 4.5 g/dL (ref 3.5–5.0)
ALT: 25 U/L (ref 0–44)
AST: 29 U/L (ref 15–41)
Alkaline Phosphatase: 29 U/L — ABNORMAL LOW (ref 38–126)
Anion gap: 9 (ref 5–15)
BUN: 19 mg/dL (ref 6–20)
CHLORIDE: 99 mmol/L (ref 98–111)
CO2: 30 mmol/L (ref 22–32)
CREATININE: 1.13 mg/dL (ref 0.61–1.24)
Calcium: 9.5 mg/dL (ref 8.9–10.3)
GFR calc Af Amer: >60 mL/min (ref >60)
GFR calc non Af Amer: >60 mL/min (ref >60)
GLUCOSE: 126 mg/dL — AB (ref 70–99)
Potassium: 3.7 mmol/L (ref 3.5–5.1)
SODIUM: 138 mmol/L (ref 135–145)
Total Bilirubin: 0.9 mg/dL (ref 0.3–1.2)
Total Protein: 8.2 g/dL — ABNORMAL HIGH (ref 6.5–8.1)

## 2018-07-09 LAB — APTT: aPTT: 32 seconds (ref 24–36)

## 2018-07-09 LAB — GLUCOSE, CAPILLARY: Glucose-Capillary: 94 mg/dL (ref 70–99)

## 2018-07-09 LAB — PROTIME-INR
INR: 1
Prothrombin Time: 13.1 seconds (ref 11.4–15.2)

## 2018-07-09 LAB — TROPONIN I: TROPONIN I: 0.15 ng/mL — AB (ref <0.03)

## 2018-07-09 MED ORDER — VALACYCLOVIR HCL 500 MG PO TABS
1000.0000 mg | ORAL_TABLET | Freq: Once | ORAL | Status: AC
  Administered 2018-07-09: 1000 mg via ORAL
  Filled 2018-07-09: qty 2

## 2018-07-09 MED ORDER — PREDNISONE 20 MG PO TABS
60.0000 mg | ORAL_TABLET | Freq: Once | ORAL | Status: AC
  Administered 2018-07-09: 60 mg via ORAL
  Filled 2018-07-09: qty 3

## 2018-07-09 MED ORDER — PREDNISONE 10 MG PO TABS: ORAL_TABLET | ORAL | 0 refills | Status: DC

## 2018-07-09 MED ORDER — VALACYCLOVIR HCL 1 G PO TABS: 1000.0000 mg | ORAL_TABLET | Freq: Three times a day (TID) | ORAL | 0 refills | Status: AC

## 2018-07-09 NOTE — ED Notes (Signed)
ED Provider at bedside. 

## 2018-07-09 NOTE — ED Notes (Signed)
Pt alert and oriented X4, active, cooperative, pt in NAD. RR even and unlabored, color WNL.  Pt informed to return if any life threatening symptoms occur.  Discharge and followup instructions reviewed. Ambulatory.

## 2018-07-09 NOTE — ED Notes (Signed)
CODE STROKE CALLED TO [id number] 

## 2018-07-09 NOTE — ED Triage Notes (Signed)
Pt states he woke up feeling funny this morning and about 1 hr pta around 7am his noticed some numbness to the right side of his face with facial droop noted. Pt also c/o right arm weakness. Pt has a hx of afib and states he has been in and out of it for the past 3 days.

## 2018-07-09 NOTE — ED Notes (Signed)
CODE  STROKE  CNL PER  DR  J  WILLIAMS MD 

## 2018-07-09 NOTE — ED Notes (Signed)
Date and time results received: 07/09/18 9:01 AM  (use smartphrase ".now" to insert current time)  Test: troponin Critical Value: 0.15  Name of Provider Notified: Dr. Daryel November

## 2018-07-09 NOTE — ED Provider Notes (Signed)
[facility] Emergency Department Provider Note       Time seen: ----------------------------------------- 8:13 AM on 07/09/2018 -----------------------------------------   I have reviewed the triage vital signs and the nursing notes.  HISTORY   Chief Complaint No chief complaint on file.    HPI Randall Fields is a 42 y.o. male with a history of atrial fibrillation, hypertension, hyperlipidemia, WPW who presents to the ED for numbness to the right side of his face with facial drooping and right arm weakness.  Patient states he woke up with a funny feeling, states that his right arm does not feel right.  Main complaint is right-sided facial drooping.  He has never had this happen before, currently on Eliquis for chronic A. fib.  Past Medical History:  Diagnosis Date  . Atrial fibrillation (HCC)   . Hypercholesteremia   . Hypertension   . Palpitation   . Sleep apnea   . Wolff-Parkinson-White syndrome     Patient Active Problem List   Diagnosis Date Noted  . Wolff-Parkinson-White (WPW) syndrome 06/16/2017    Past Surgical History:  Procedure Laterality Date  . ELECTROPHYSIOLOGIC STUDY N/A 01/18/2016   Procedure: CARDIOVERSION;  Surgeon: Laurier Nancy, MD;  Location: [facility] ORS;  Service: Cardiovascular;  Laterality: N/A;  . TEE WITHOUT CARDIOVERSION N/A 01/18/2016   Procedure: TRANSESOPHAGEAL ECHOCARDIOGRAM (TEE);  Surgeon: Laurier Nancy, MD;  Location: [facility] ORS;  Service: Cardiovascular;  Laterality: N/A;    Allergies Ceclor [cefaclor]  Social History Social History   Tobacco Use  . Smoking status: Current Every Day Smoker    Types: E-cigarettes  . Smokeless tobacco: Current User    Types: Chew  Substance Use Topics  . Alcohol use: No  . Drug use: No   Review of Systems Constitutional: Negative for fever. Cardiovascular: Negative for chest pain. Respiratory: Negative for shortness of breath. Gastrointestinal: Negative for  abdominal pain, vomiting and diarrhea. Musculoskeletal: Negative for back pain. Skin: Negative for rash. Neurological: Positive for facial drooping, right arm weakness  All systems negative/normal/unremarkable except as stated in the HPI  ____________________________________________   PHYSICAL EXAM:  VITAL SIGNS: ED Triage Vitals  Enc Vitals Group     BP      Pulse      Resp      Temp      Temp src      SpO2      Weight      Height      Head Circumference      Peak Flow      Pain Score      Pain Loc      Pain Edu?      Excl. in GC?    Constitutional: Alert and oriented. Well appearing and in no distress. Eyes: Conjunctivae are normal. Normal extraocular movements. ENT   Head: Normocephalic and atraumatic.   Nose: No congestion/rhinnorhea.   Mouth/Throat: Mucous membranes are moist.   Neck: No stridor. Cardiovascular: Normal rate, regular rhythm. No murmurs, rubs, or gallops. Respiratory: Normal respiratory effort without tachypnea nor retractions. Breath sounds are clear and equal bilaterally. No wheezes/rales/rhonchi. Gastrointestinal: Soft and nontender. Normal bowel sounds Musculoskeletal: Nontender with normal range of motion in extremities. No lower extremity tenderness nor edema. Neurologic: Normal strength, sensation, speech.  Right-sided facial drooping is noted that involves the forehead Skin:  Skin is warm, dry and intact. No rash noted. Psychiatric: Mood and affect are normal. Speech and behavior are normal.  ____________________________________________  EKG: Interpreted by me.  This rhythm with a rate of 76 bpm, likely left atrial enlargement, LVH with repolarization abnormality, long QT  ____________________________________________  ED COURSE:  As part of my medical decision making, I reviewed the following data within the electronic MEDICAL RECORD NUMBER History obtained from family if available, nursing notes, old chart and ekg, as well as  notes from prior ED visits. Patient presented for acute strokelike symptoms, we will assess with labs and imaging as indicated at this time.   Procedures ____________________________________________   LABS (pertinent positives/negatives)  Labs Reviewed  DIFFERENTIAL - Abnormal; Notable for the following components:      Result Value   Lymphs Abs 5.8 (*)    All other components within normal limits  COMPREHENSIVE METABOLIC PANEL - Abnormal; Notable for the following components:   Glucose, Bld 126 (*)    Total Protein 8.2 (*)    Alkaline Phosphatase 29 (*)    All other components within normal limits  TROPONIN I - Abnormal; Notable for the following components:   Troponin I 0.15 (*)    All other components within normal limits  PROTIME-INR  APTT  CBC  GLUCOSE, CAPILLARY  CBG MONITORING, ED    RADIOLOGY  CT head is unremarkable Brain MRI is normal ____________________________________________  DIFFERENTIAL DIAGNOSIS   Bell's palsy, CVA, TIA, anxiety  FINAL ASSESSMENT AND PLAN  Bell's palsy   Plan: The patient had presented for right-sided facial drooping. Patient's labs were unremarkable with exception of elevated troponin which is chronically elevated. Patient's imaging was negative including CT imaging and MRI.  Reason for MRI was describing some right arm weakness but MRI is negative.  He is cleared for outpatient follow-up.   Ulice Dash, MD   Note: This note was generated in part or whole with voice recognition software. Voice recognition is usually quite accurate but there are transcription errors that can and very often do occur. I apologize for any typographical errors that were not detected and corrected.     Emily Filbert, MD 07/09/18 1025

## 2018-07-09 NOTE — ED Notes (Signed)
Pt on phone with MRI

## 2018-07-09 NOTE — ED Notes (Signed)
Pt refusing further cardiac monitoring. Pt placed on BP and oxygen monitoring at this time.

## 2018-07-09 NOTE — ED Notes (Signed)
Patient transported to MRI 

## 2018-07-09 NOTE — ED Notes (Addendum)
Right sided neck "cramping". Bad taste in mouth for a few days. When patient drinks 3 oz water, pt has dribbling out of right side of mouth.

## 2019-05-07 ENCOUNTER — Ambulatory Visit: Payer: Self-pay | Admitting: Urology

## 2020-10-08 DIAGNOSIS — Z79899 Other long term (current) drug therapy: Secondary | ICD-10-CM | POA: Diagnosis not present

## 2020-10-08 DIAGNOSIS — R69 Illness, unspecified: Secondary | ICD-10-CM | POA: Diagnosis not present

## 2020-10-08 DIAGNOSIS — Z5181 Encounter for therapeutic drug level monitoring: Secondary | ICD-10-CM | POA: Diagnosis not present

## 2020-10-08 DIAGNOSIS — M109 Gout, unspecified: Secondary | ICD-10-CM | POA: Diagnosis not present

## 2020-10-08 DIAGNOSIS — M791 Myalgia, unspecified site: Secondary | ICD-10-CM | POA: Diagnosis not present

## 2020-10-08 DIAGNOSIS — I4891 Unspecified atrial fibrillation: Secondary | ICD-10-CM | POA: Diagnosis not present

## 2020-11-12 DIAGNOSIS — Z5181 Encounter for therapeutic drug level monitoring: Secondary | ICD-10-CM | POA: Diagnosis not present

## 2020-11-12 DIAGNOSIS — Z79899 Other long term (current) drug therapy: Secondary | ICD-10-CM | POA: Diagnosis not present

## 2020-11-12 DIAGNOSIS — I4891 Unspecified atrial fibrillation: Secondary | ICD-10-CM | POA: Diagnosis not present

## 2020-11-12 DIAGNOSIS — R69 Illness, unspecified: Secondary | ICD-10-CM | POA: Diagnosis not present

## 2020-11-12 DIAGNOSIS — M109 Gout, unspecified: Secondary | ICD-10-CM | POA: Diagnosis not present

## 2020-12-10 DIAGNOSIS — M5459 Other low back pain: Secondary | ICD-10-CM | POA: Diagnosis not present

## 2020-12-10 DIAGNOSIS — Z79899 Other long term (current) drug therapy: Secondary | ICD-10-CM | POA: Diagnosis not present

## 2020-12-10 DIAGNOSIS — R69 Illness, unspecified: Secondary | ICD-10-CM | POA: Diagnosis not present

## 2020-12-10 DIAGNOSIS — Z5181 Encounter for therapeutic drug level monitoring: Secondary | ICD-10-CM | POA: Diagnosis not present

## 2021-01-31 DIAGNOSIS — I480 Paroxysmal atrial fibrillation: Secondary | ICD-10-CM | POA: Diagnosis not present

## 2021-01-31 DIAGNOSIS — I422 Other hypertrophic cardiomyopathy: Secondary | ICD-10-CM | POA: Diagnosis not present

## 2021-01-31 DIAGNOSIS — I428 Other cardiomyopathies: Secondary | ICD-10-CM | POA: Diagnosis not present

## 2021-02-04 DIAGNOSIS — M5459 Other low back pain: Secondary | ICD-10-CM | POA: Diagnosis not present

## 2021-02-04 DIAGNOSIS — Z5181 Encounter for therapeutic drug level monitoring: Secondary | ICD-10-CM | POA: Diagnosis not present

## 2021-02-04 DIAGNOSIS — Z79899 Other long term (current) drug therapy: Secondary | ICD-10-CM | POA: Diagnosis not present

## 2021-02-04 DIAGNOSIS — R69 Illness, unspecified: Secondary | ICD-10-CM | POA: Diagnosis not present

## 2021-02-14 DIAGNOSIS — R109 Unspecified abdominal pain: Secondary | ICD-10-CM | POA: Diagnosis not present

## 2021-02-14 DIAGNOSIS — N029 Recurrent and persistent hematuria with unspecified morphologic changes: Secondary | ICD-10-CM | POA: Diagnosis not present

## 2021-02-14 DIAGNOSIS — R319 Hematuria, unspecified: Secondary | ICD-10-CM | POA: Diagnosis not present

## 2021-02-14 DIAGNOSIS — I10 Essential (primary) hypertension: Secondary | ICD-10-CM | POA: Diagnosis not present

## 2021-02-14 DIAGNOSIS — E782 Mixed hyperlipidemia: Secondary | ICD-10-CM | POA: Diagnosis not present

## 2021-02-14 DIAGNOSIS — E785 Hyperlipidemia, unspecified: Secondary | ICD-10-CM | POA: Diagnosis not present

## 2021-02-14 DIAGNOSIS — R7301 Impaired fasting glucose: Secondary | ICD-10-CM | POA: Diagnosis not present

## 2021-03-02 ENCOUNTER — Other Ambulatory Visit: Payer: Self-pay

## 2021-03-02 ENCOUNTER — Observation Stay
Admit: 2021-03-02 | Discharge: 2021-03-02 | Disposition: A | Discharge status: Home / Self Care | Payer: Medicare HMO | Attending: Internal Medicine | Admitting: Internal Medicine

## 2021-03-02 ENCOUNTER — Observation Stay
Admission: EM | Admit: 2021-03-02 | Discharge: 2021-03-03 | Disposition: A | Discharge status: Home / Self Care | Payer: Medicare HMO | Attending: Internal Medicine | Admitting: Internal Medicine

## 2021-03-02 ENCOUNTER — Emergency Department: Payer: Medicare HMO

## 2021-03-02 DIAGNOSIS — R69 Illness, unspecified: Secondary | ICD-10-CM | POA: Diagnosis not present

## 2021-03-02 DIAGNOSIS — I480 Paroxysmal atrial fibrillation: Secondary | ICD-10-CM | POA: Diagnosis not present

## 2021-03-02 DIAGNOSIS — R0689 Other abnormalities of breathing: Secondary | ICD-10-CM | POA: Diagnosis not present

## 2021-03-02 DIAGNOSIS — Z743 Need for continuous supervision: Secondary | ICD-10-CM | POA: Diagnosis not present

## 2021-03-02 DIAGNOSIS — I10 Essential (primary) hypertension: Secondary | ICD-10-CM | POA: Diagnosis not present

## 2021-03-02 DIAGNOSIS — I456 Pre-excitation syndrome: Secondary | ICD-10-CM | POA: Diagnosis not present

## 2021-03-02 DIAGNOSIS — Z79899 Other long term (current) drug therapy: Secondary | ICD-10-CM | POA: Diagnosis not present

## 2021-03-02 DIAGNOSIS — R42 Dizziness and giddiness: Secondary | ICD-10-CM | POA: Diagnosis not present

## 2021-03-02 DIAGNOSIS — R001 Bradycardia, unspecified: Secondary | ICD-10-CM | POA: Diagnosis not present

## 2021-03-02 DIAGNOSIS — R55 Syncope and collapse: Secondary | ICD-10-CM | POA: Diagnosis not present

## 2021-03-02 DIAGNOSIS — Z20822 Contact with and (suspected) exposure to covid-19: Secondary | ICD-10-CM | POA: Diagnosis not present

## 2021-03-02 DIAGNOSIS — Z7901 Long term (current) use of anticoagulants: Secondary | ICD-10-CM | POA: Diagnosis not present

## 2021-03-02 DIAGNOSIS — R0902 Hypoxemia: Secondary | ICD-10-CM | POA: Diagnosis not present

## 2021-03-02 DIAGNOSIS — F17290 Nicotine dependence, other tobacco product, uncomplicated: Secondary | ICD-10-CM | POA: Diagnosis not present

## 2021-03-02 LAB — BASIC METABOLIC PANEL
Anion gap: 9 (ref 5–15)
BUN: 34 mg/dL — ABNORMAL HIGH (ref 6–20)
CO2: 27 mmol/L (ref 22–32)
Calcium: 9.6 mg/dL (ref 8.9–10.3)
Chloride: 102 mmol/L (ref 98–111)
Creatinine, Ser: 1.15 mg/dL (ref 0.61–1.24)
GFR, Estimated: >60 mL/min (ref >60)
Glucose, Bld: 107 mg/dL — ABNORMAL HIGH (ref 70–99)
Potassium: 4.2 mmol/L (ref 3.5–5.1)
Sodium: 138 mmol/L (ref 135–145)

## 2021-03-02 LAB — CBC
HCT: 42 % (ref 39.0–52.0)
Hemoglobin: 14.7 g/dL (ref 13.0–17.0)
MCH: 29.3 pg (ref 26.0–34.0)
MCHC: 35 g/dL (ref 30.0–36.0)
MCV: 83.8 fL (ref 80.0–100.0)
Platelets: 202 10*3/uL (ref 150–400)
RBC: 5.01 MIL/uL (ref 4.22–5.81)
RDW: 12.4 % (ref 11.5–15.5)
WBC: 8.9 10*3/uL (ref 4.0–10.5)
nRBC: 0 % (ref 0.0–0.2)

## 2021-03-02 LAB — TROPONIN I (HIGH SENSITIVITY)
Troponin I (High Sensitivity): 80 ng/L — ABNORMAL HIGH (ref <18)
Troponin I (High Sensitivity): 125 ng/L (ref <18)
Troponin I (High Sensitivity): 131 ng/L (ref <18)
Troponin I (High Sensitivity): 117 ng/L (ref <18)

## 2021-03-02 LAB — MAGNESIUM: Magnesium: 2 mg/dL (ref 1.7–2.4)

## 2021-03-02 LAB — TSH: TSH: 1.892 u[IU]/mL (ref 0.350–4.500)

## 2021-03-02 LAB — T4, FREE: Free T4: 0.86 ng/dL (ref 0.61–1.12)

## 2021-03-02 LAB — RESP PANEL BY RT-PCR (FLU A&B, COVID) ARPGX2
Influenza A by PCR: NEGATIVE
Influenza B by PCR: NEGATIVE
SARS Coronavirus 2 by RT PCR: NEGATIVE

## 2021-03-02 MED ORDER — LISINOPRIL 10 MG PO TABS
10.0000 mg | ORAL_TABLET | Freq: Every day | ORAL | Status: DC
  Administered 2021-03-03: 10 mg via ORAL
  Filled 2021-03-02: qty 1

## 2021-03-02 MED ORDER — SODIUM CHLORIDE 0.9% FLUSH: 3.0000 mL | Freq: Two times a day (BID) | INTRAVENOUS | Status: DC

## 2021-03-02 MED ORDER — SODIUM CHLORIDE 0.9 % IV SOLN: INTRAVENOUS | Status: DC

## 2021-03-02 MED ORDER — BUPRENORPHINE HCL-NALOXONE HCL 8-2 MG SL FILM: 1.0000 | ORAL_FILM | SUBLINGUAL | Status: DC

## 2021-03-02 MED ORDER — ACETAMINOPHEN 650 MG RE SUPP: 650.0000 mg | Freq: Four times a day (QID) | RECTAL | Status: DC | PRN

## 2021-03-02 MED ORDER — BUPRENORPHINE HCL-NALOXONE HCL 8-2 MG SL SUBL
1.0000 | SUBLINGUAL_TABLET | Freq: Every day | SUBLINGUAL | Status: DC
  Administered 2021-03-03: 1 via SUBLINGUAL
  Filled 2021-03-02: qty 1

## 2021-03-02 MED ORDER — HYDROCHLOROTHIAZIDE 12.5 MG PO CAPS
12.5000 mg | ORAL_CAPSULE | Freq: Every day | ORAL | Status: DC
  Filled 2021-03-02: qty 1

## 2021-03-02 MED ORDER — ACETAMINOPHEN 325 MG PO TABS: 650.0000 mg | ORAL_TABLET | Freq: Four times a day (QID) | ORAL | Status: DC | PRN

## 2021-03-02 MED ORDER — ONDANSETRON HCL 4 MG PO TABS
4.0000 mg | ORAL_TABLET | Freq: Four times a day (QID) | ORAL | Status: DC | PRN
Start: 2021-03-02 — End: 2021-03-03

## 2021-03-02 MED ORDER — LISINOPRIL-HYDROCHLOROTHIAZIDE 10-12.5 MG PO TABS: 1.0000 | ORAL_TABLET | Freq: Every day | ORAL | Status: DC

## 2021-03-02 MED ORDER — ONDANSETRON HCL 4 MG/2ML IJ SOLN: 4.0000 mg | Freq: Four times a day (QID) | INTRAMUSCULAR | Status: DC | PRN

## 2021-03-02 MED ORDER — APIXABAN 5 MG PO TABS
5.0000 mg | ORAL_TABLET | Freq: Two times a day (BID) | ORAL | Status: DC
  Administered 2021-03-03: 5 mg via ORAL
  Filled 2021-03-02: qty 1

## 2021-03-02 MED ORDER — PERFLUTREN LIPID MICROSPHERE
1.0000 mL | INTRAVENOUS | Status: AC | PRN
  Administered 2021-03-02: 2 mL via INTRAVENOUS
  Filled 2021-03-02: qty 10

## 2021-03-02 NOTE — ED Notes (Signed)
Report off to robin rn cpod nurse.

## 2021-03-02 NOTE — ED Notes (Signed)
Pt alert  Family with pt.  Pt waiting on admission.

## 2021-03-02 NOTE — ED Notes (Signed)
Pt stated that he has a hx of weakness and passing out; however these episodes are new and he feels "like he couldn't breathe then feels weak and dizzy vision went dark"; lasted maybe five minutes. Denies CP and SOB atm.

## 2021-03-02 NOTE — ED Triage Notes (Signed)
Pt comes via EMS with c/o bradycardia. EMs reports pt was playing golf today and pulse got down to 30s.  EKG performed at this time. HR-59  Hx of ablation and cardiovert.  Pt states he was feeling fine and then in one sec everything was black. Pt denies any LOC.

## 2021-03-02 NOTE — H&P (Addendum)
History and Physical    Randall Fields ZHY:[id number] DOB: 07/07/1976 DOA: 03/02/2021  PCP: Fayrene Helper, NP   Patient coming from: Home  I have personally briefly reviewed patient's old medical records in [facility]  Chief Complaint: " I passed out"  HPI: Randall Fields is a 45 y.o. 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 Patient states that he was sitting in his chair when he suddenly felt like he was hit in the head with a hammer and he became very dizzy, his vision became blurred and he got very diaphoretic and blacked out. He had another episode when his wife came back from work and he was describing to her about how he did not feel well when all of a sudden she noticed his body stiffened up and he had some generalized shaking but there was no fecal or urinary incontinence and no tongue bite.  After this episode he looked at his watch and his heart rate was in the 30s.  His heart rate at rest is usually in the 50s. He states that he has had 1 syncopal episode several years ago but did not feel as bad as he felt today. He denies having any chest pain, no shortness of breath, no nausea, no vomiting, no palpitations, no leg swelling, no orthopnea, no abdominal pain, no changes in his bowel habits, no urinary symptoms, no fever, no chills, no headache, no blurred vision or any focal deficit. Labs show sodium 138, potassium 4.2, chloride 102, bicarb 27, glucose 107, BUN 34, creatinine 1.15, calcium 9.6, magnesium 2.0, troponin 80, white count 8.9, hemoglobin 14.7, hematocrit 42, MCV 83.8, RDW 12.4, platelet count 219, TSH 1.89, T4 0.86 Respiratory  viral panel is negative CT scan of the head without contrast shows no acute findings Twelve-lead EKG reviewed by me shows sinus bradycardia with LVH and marked ST abnormality.   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 Patient is noted to be bradycardic at rest with heart rate in the low 40s and is on metoprolol 25 mg daily. We referred to observation status and cardiology has been consulted.      Review of Systems: As per HPI otherwise all other systems reviewed and negative.    Past Medical History:  Diagnosis Date  . Atrial fibrillation (HCC)   . Hypercholesteremia   . Hypertension   . Palpitation   . Sleep apnea   . Wolff-Parkinson-White syndrome     Past Surgical History:  Procedure Laterality Date  . ELECTROPHYSIOLOGIC STUDY N/A 01/18/2016   Procedure: CARDIOVERSION;  Surgeon: Laurier Nancy, MD;  Location: [facility] ORS;  Service: Cardiovascular;  Laterality: N/A;  . TEE WITHOUT CARDIOVERSION N/A 01/18/2016   Procedure: TRANSESOPHAGEAL ECHOCARDIOGRAM (TEE);  Surgeon: Laurier Nancy, MD;  Location: [facility] ORS;  Service: Cardiovascular;  Laterality: N/A;     reports that he has been smoking e-cigarettes. His smokeless tobacco use includes chew. He reports that he does not drink alcohol and does not use drugs.  Allergies  Allergen Reactions  . Ceclor [Cefaclor] Hives    hives    Family History  Problem Relation Age of Onset  . Arrhythmia Father       Prior to Admission medications   Medication Sig Start Date End Date Taking? Authorizing Provider  Buprenorphine HCl-Naloxone HCl 8-2 MG FILM Place 1 Film under the tongue every morning. 05/16/18   [provider]   ELIQUIS 5 MG TABS tablet Take 5 mg by mouth 2 (two) times daily. 06/11/18   [provider]  lisinopril-hydrochlorothiazide (PRINZIDE,ZESTORETIC) 10-12.5 MG tablet Take 1 tablet by mouth daily.    [provider]  naproxen (NAPROSYN) 500 MG tablet Take 1 tablet (500 mg total) by mouth 2 (two) times daily with a meal. Patient not taking: Reported on 07/09/2018 07/07/17   Jene Every, MD  predniSONE (DELTASONE) 10 MG tablet Take 60 mg daily for 5 days, then decrease by 10 mg daily for 5 days. 07/09/18   Emily Filbert, MD  rivaroxaban (XARELTO) 20 MG TABS tablet Take 1 tablet (20 mg total) by mouth daily with supper. Patient not taking: Reported on 07/09/2018 06/18/17   Katha Hamming, MD    Physical Exam: Vitals:   03/02/21 1439 03/02/21 1535 03/02/21 1548 03/02/21 1630  BP:  (!) 115/58  (!) 121/55  Pulse:  93  (!) 46  Resp: 19 12  12   Temp:      TempSrc:      SpO2:  92% 100% 97%     Vitals:   03/02/21 1439 03/02/21 1535 03/02/21 1548 03/02/21 1630  BP:  (!) 115/58  (!) 121/55  Pulse:  93  (!) 46  Resp: 19 12  12   Temp:      TempSrc:      SpO2:  92% 100% 97%      Constitutional: Alert and oriented x 3 . Not in any apparent distress HEENT:      Head: Normocephalic and atraumatic.         Eyes: PERLA, EOMI, Conjunctivae are normal. Sclera is non-icteric.       Mouth/Throat: Mucous membranes are moist.       Neck: Supple with no signs of meningismus. Cardiovascular:  Bradycardic. No murmurs, gallops, or rubs. 2+ symmetrical distal pulses are present . No JVD. No LE edema Respiratory: Respiratory effort normal .Lungs sounds clear bilaterally. No wheezes, crackles, or rhonchi.  Gastrointestinal: Soft, non tender, and non distended with positive bowel sounds.  Genitourinary: No CVA tenderness. Musculoskeletal: Nontender with normal range of motion in all extremities. No cyanosis, or erythema of extremities. Neurologic:  Face is symmetric. Moving all  extremities. No gross focal neurologic deficits . Skin: Skin is warm, dry.  No rash or ulcers Psychiatric: Mood and affect are normal   Labs on Admission: I have personally reviewed following labs and imaging studies  CBC: Recent Labs  Lab 03/02/21 1438  WBC 8.9  HGB 14.7  HCT 42.0  MCV 83.8  PLT 202   Basic Metabolic Panel: Recent Labs  Lab 03/02/21 1438  NA 138  K 4.2  CL 102  CO2 27  GLUCOSE 107*  BUN 34*  CREATININE 1.15  CALCIUM 9.6  MG 2.0   GFR: CrCl cannot be calculated (Unknown ideal weight.). Liver Function Tests: No results for input(s): AST, ALT, ALKPHOS, BILITOT, PROT, ALBUMIN in the last 168 hours. No results for input(s): LIPASE, AMYLASE in the last 168 hours. No results for input(s): AMMONIA in the last 168 hours. Coagulation Profile:  No results for input(s): INR, PROTIME in the last 168 hours. Cardiac Enzymes: No results for input(s): CKTOTAL, CKMB, CKMBINDEX, TROPONINI in the last 168 hours. BNP (last 3 results) No results for input(s): PROBNP in the last 8760 hours. HbA1C: No results for input(s): HGBA1C in the last 72 hours. CBG: No results for input(s): GLUCAP in the last 168 hours. Lipid Profile: No results for input(s): CHOL, HDL, LDLCALC, TRIG, CHOLHDL, LDLDIRECT in the last 72 hours. Thyroid Function Tests: Recent Labs    03/02/21 1438  TSH 1.892  FREET4 0.86   Anemia Panel: No results for input(s): VITAMINB12, FOLATE, FERRITIN, TIBC, IRON, RETICCTPCT in the last 72 hours. Urine analysis:    Component Value Date/Time   COLORURINE AMBER (A) 07/07/2017 0533   APPEARANCEUR CLOUDY (A) 07/07/2017 0533   LABSPEC 1.023 07/07/2017 0533   PHURINE 5.0 07/07/2017 0533   GLUCOSEU NEGATIVE 07/07/2017 0533   HGBUR LARGE (A) 07/07/2017 0533   BILIRUBINUR NEGATIVE 07/07/2017 0533   KETONESUR NEGATIVE 07/07/2017 0533   PROTEINUR 100 (A) 07/07/2017 0533   NITRITE NEGATIVE 07/07/2017 0533   LEUKOCYTESUR NEGATIVE 07/07/2017 0533     Radiological Exams on Admission: CT Head Wo Contrast  Result Date: 03/02/2021 CLINICAL DATA:  Dizziness. EXAM: CT HEAD WITHOUT CONTRAST TECHNIQUE: Contiguous axial images were obtained from the base of the skull through the vertex without intravenous contrast. COMPARISON:  Head CT and MRI 07/09/2018 FINDINGS: Brain: There is no evidence of an acute infarct, intracranial hemorrhage, mass, midline shift, or extra-axial fluid collection. Ventricles and sulci are normal. Vascular: No hyperdense vessel. Skull: No fracture or suspicious osseous lesion. Sinuses/Orbits: Mild bilateral ethmoid air cell mucosal thickening. Clear mastoid air cells. Unremarkable orbits. Other: None. IMPRESSION: Unremarkable CT appearance of the brain. Electronically Signed   By: Sebastian Ache M.D.   On: 03/02/2021 16:11     Assessment/Plan Principal Problem:   Syncope and collapse Active Problems:   Wolff-Parkinson-White (WPW) syndrome   AF (paroxysmal atrial fibrillation) (HCC)   Essential hypertension      Syncope and collapse Most likely secondary to bradycardia Patient's heart rate at rest has been in the low 40s We will hold metoprolol Request cardiology consult Place patient on a cardiac monitor to rule out arrhythmias Obtain 2D echocardiogram    Paroxysmal atrial fibrillation Continue Eliquis as primary prophylaxis for an acute stroke   Hypertension Continue lisinopril/HCTZ   DVT prophylaxis: Eliquis Code Status: full code Family Communication: Greater than 50% of time was spent discussing patient's condition and plan of care with him at the bedside.  All questions and concerns have been addressed.  He verbalizes understanding and agrees with the plan. Disposition Plan: Back to previous home environment Consults called: Cardiology  Status: Observation    Kyilee Gregg MD [facility]     03/02/2021, 5:53 PM

## 2021-03-02 NOTE — ED Provider Notes (Signed)
[facility] Emergency Department Provider Note  ____________________________________________   Event Date/Time   First MD Initiated Contact with Patient 03/02/21 1533     (approximate)  I have reviewed the triage vital signs and the nursing notes.   HISTORY  Chief Complaint Bradycardia    HPI Randall Fields is a 45 y.o. male with WPW/afib s/p ablation who comes in for bradycardia.  Patient reports that his heart rates normally run in the 50s to 60s.  He takes metoprolol 25 mg once daily.  He is followed by [facility] cardiology.  He states that today he was sitting on the couch when he had this feeling come over him where he felt really awful, vision went blurry, felt dizzy and then blacked out.  Unclear how long he was out for he thinks 5 minutes.  He then stated that his partner came home and he was telling her how he was not feeling well and he had another episode where he blacked out.  She noticed that his body stiffened up and had some small shaking that lasted for a few seconds, did not urinate on self, did not bite his tongue.  Does not sound like he was postictal afterwards.  He states that after these episodes he noted on his watch that his heart rates were in the 30s.  Denies this ever happening previously.  States that he does have a family of cardiomyopathy and was told he might need to have a pacemaker or ICD at some point in his life.  At this time he just states that he feels fatigue and tired.          Past Medical History:  Diagnosis Date  . Atrial fibrillation (HCC)   . Hypercholesteremia   . Hypertension   . Palpitation   . Sleep apnea   . Wolff-Parkinson-White syndrome     Patient Active Problem List   Diagnosis Date Noted  . Wolff-Parkinson-White (WPW) syndrome 06/16/2017    Past Surgical History:  Procedure Laterality Date  . ELECTROPHYSIOLOGIC STUDY N/A 01/18/2016   Procedure: CARDIOVERSION;  Surgeon: Laurier Nancy, MD;   Location: [facility] ORS;  Service: Cardiovascular;  Laterality: N/A;  . TEE WITHOUT CARDIOVERSION N/A 01/18/2016   Procedure: TRANSESOPHAGEAL ECHOCARDIOGRAM (TEE);  Surgeon: Laurier Nancy, MD;  Location: [facility] ORS;  Service: Cardiovascular;  Laterality: N/A;    Prior to Admission medications   Medication Sig Start Date End Date Taking? Authorizing Provider  Buprenorphine HCl-Naloxone HCl 8-2 MG FILM Place 1 Film under the tongue every morning. 05/16/18   [provider]  ELIQUIS 5 MG TABS tablet Take 5 mg by mouth 2 (two) times daily. 06/11/18   [provider]  lisinopril-hydrochlorothiazide (PRINZIDE,ZESTORETIC) 10-12.5 MG tablet Take 1 tablet by mouth daily.    [provider]  naproxen (NAPROSYN) 500 MG tablet Take 1 tablet (500 mg total) by mouth 2 (two) times daily with a meal. Patient not taking: Reported on 07/09/2018 07/07/17   Jene Every, MD  predniSONE (DELTASONE) 10 MG tablet Take 60 mg daily for 5 days, then decrease by 10 mg daily for 5 days. 07/09/18   Emily Filbert, MD  rivaroxaban (XARELTO) 20 MG TABS tablet Take 1 tablet (20 mg total) by mouth daily with supper. Patient not taking: Reported on 07/09/2018 06/18/17   Katha Hamming, MD    Allergies Ceclor [cefaclor]  No family history on file.  Social History Social History   Tobacco Use  . Smoking status:  Current Every Day Smoker    Types: E-cigarettes  . Smokeless tobacco: Current User    Types: Chew  Substance Use Topics  . Alcohol use: No  . Drug use: No      Review of Systems Constitutional: No fever/chills, dizzy Eyes: No visual changes.  Vision going black ENT: No sore throat. Cardiovascular no chest pain.  Low heart rate Respiratory: Denies shortness of breath. Gastrointestinal: No abdominal pain.  No nausea, no vomiting.  No diarrhea.  No constipation. Genitourinary: Negative for dysuria. Musculoskeletal: Negative for back pain. Skin: Negative for  rash. Neurological: Negative for headaches, focal weakness or numbness. All other ROS negative ____________________________________________   PHYSICAL EXAM:  VITAL SIGNS: ED Triage Vitals  Enc Vitals Group     BP 03/02/21 1438 140/80     Pulse Rate 03/02/21 1438 (!) 58     Resp 03/02/21 1439 19     Temp 03/02/21 1438 98.3 F (36.8 C)     Temp Source 03/02/21 1438 Oral     SpO2 03/02/21 1438 100 %     Weight --      Height --      Head Circumference --      Peak Flow --      Pain Score 03/02/21 1435 4     Pain Loc --      Pain Edu? --      Excl. in GC? --     Constitutional: Alert and oriented. Well appearing and in no acute distress. Eyes: Conjunctivae are normal. EOMI. Head: Atraumatic. Nose: No congestion/rhinnorhea. Mouth/Throat: Mucous membranes are moist.   Neck: No stridor. Trachea Midline. FROM Cardiovascular bradycardic grossly normal heart sounds.  Good peripheral circulation. Respiratory: Normal respiratory effort.  No retractions. Lungs CTAB. Gastrointestinal: Soft and nontender. No distention. No abdominal bruits.  Musculoskeletal: No lower extremity tenderness nor edema.  No joint effusions. Neurologic:  Normal speech and language. No gross focal neurologic deficits are appreciated.  Skin:  Skin is warm, dry and intact. No rash noted. Psychiatric: Mood and affect are normal. Speech and behavior are normal. GU: Deferred   ____________________________________________   LABS (all labs ordered are listed, but only abnormal results are displayed)  Labs Reviewed  BASIC METABOLIC PANEL - Abnormal; Notable for the following components:      Result Value   Glucose, Bld 107 (*)    BUN 34 (*)    All other components within normal limits  RESP PANEL BY RT-PCR (FLU A&B, COVID) ARPGX2  CBC  URINALYSIS, COMPLETE (UACMP) WITH MICROSCOPIC  MAGNESIUM  TSH  T4, FREE  CBG MONITORING, ED  TROPONIN I (HIGH SENSITIVITY)    ____________________________________________   ED ECG REPORT I, Concha Se, the attending physician, personally viewed and interpreted this ECG.  Sinus brady rate of 58, with LBBB and t wave inversions diffuse. Looks similar to prior EKG.  ____________________________________________  RADIOLOGY Vela Prose, personally viewed and evaluated these images (plain radiographs) as part of my medical decision making, as well as reviewing the written report by the radiologist.  ED MD interpretation: No intracranial hemorrhage  Official radiology report(s): CT Head Wo Contrast  Result Date: 03/02/2021 CLINICAL DATA:  Dizziness. EXAM: CT HEAD WITHOUT CONTRAST TECHNIQUE: Contiguous axial images were obtained from the base of the skull through the vertex without intravenous contrast. COMPARISON:  Head CT and MRI 07/09/2018 FINDINGS: Brain: There is no evidence of an acute infarct, intracranial hemorrhage, mass, midline shift, or extra-axial fluid collection. Ventricles and  sulci are normal. Vascular: No hyperdense vessel. Skull: No fracture or suspicious osseous lesion. Sinuses/Orbits: Mild bilateral ethmoid air cell mucosal thickening. Clear mastoid air cells. Unremarkable orbits. Other: None. IMPRESSION: Unremarkable CT appearance of the brain. Electronically Signed   By: Sebastian Ache M.D.   On: 03/02/2021 16:11    ____________________________________________   PROCEDURES  Procedure(s) performed (including Critical Care):  .1-3 Lead EKG Interpretation Performed by: Concha Se, MD Authorized by: Concha Se, MD     Interpretation: abnormal     ECG rate:  50s   ECG rate assessment: bradycardic     Rhythm: sinus bradycardia     Ectopy: PVCs     Conduction: normal       ____________________________________________   INITIAL IMPRESSION / ASSESSMENT AND PLAN / ED COURSE   Randall Fields was evaluated in Emergency Department on 03/02/2021 for the symptoms described in  the history of present illness. He was evaluated in the context of the global COVID-19 pandemic, which necessitated consideration that the patient might be at risk for infection with the SARS-CoV-2 virus that causes COVID-19. Institutional protocols and algorithms that pertain to the evaluation of patients at risk for COVID-19 are in a state of rapid change based on information released by regulatory bodies including the CDC and federal and state organizations. These policies and algorithms were followed during the patient's care in the ED.    Most Likely DDx:  -Patient comes in with syncopal episodes x2.  Most likely related to the heart given his prior history.  We will keep patient on the cardiac monitor to evaluate for arrhythmia.  Currently he is sinus bradycardia with occasional PVCs.  Could be due to too much metoprolol.  But patient denies any recent changes.  Partner did report some shaking activity but does not sound like a seizure given does not sound like he had any postictal.  Will get CT head just to make sure no evidence of intercranial mass.   DDx that was also considered d/t potential to cause harm, but was found less likely based on history and physical (as detailed above): -PNA (no fevers, cough but CXR to evaluate) -PNX (reassured with equal b/l breath sounds, CXR to evaluate) -Symptomatic anemia (will get H&H) -Pulmonary embolism as no sob at rest, not pleuritic in nature, no hypoxia -Aortic Dissection as no tearing pain and no radiation to the mid back, pulses equal -Pericarditis no rub on exam, EKG changes or hx to suggest dx -Tamponade (no notable SOB, tachycardic, hypotensive) -Esophageal rupture (no h/o diffuse vomitting/no crepitus)  Labs are reassuring.  Thyroids reassuring however his troponin is elevated at 80.  CT head is negative.  Will discuss with hospital team for admission and continue trending out his cardiac markers        ____________________________________________   FINAL CLINICAL IMPRESSION(S) / ED DIAGNOSES   Final diagnoses:  Syncope, unspecified syncope type  Bradycardia     MEDICATIONS GIVEN DURING THIS VISIT:  Medications - No data to display   ED Discharge Orders    None       Note:  This document was prepared using Dragon voice recognition software and may include unintentional dictation errors.   Concha Se, MD 03/02/21 [id number]

## 2021-03-03 DIAGNOSIS — I4891 Unspecified atrial fibrillation: Secondary | ICD-10-CM | POA: Diagnosis not present

## 2021-03-03 DIAGNOSIS — R55 Syncope and collapse: Secondary | ICD-10-CM | POA: Diagnosis not present

## 2021-03-03 DIAGNOSIS — I480 Paroxysmal atrial fibrillation: Secondary | ICD-10-CM | POA: Diagnosis not present

## 2021-03-03 DIAGNOSIS — R001 Bradycardia, unspecified: Secondary | ICD-10-CM | POA: Diagnosis not present

## 2021-03-03 LAB — CBC
HCT: 42.6 % (ref 39.0–52.0)
Hemoglobin: 14.1 g/dL (ref 13.0–17.0)
MCH: 28.7 pg (ref 26.0–34.0)
MCHC: 33.1 g/dL (ref 30.0–36.0)
MCV: 86.6 fL (ref 80.0–100.0)
Platelets: 181 10*3/uL (ref 150–400)
RBC: 4.92 MIL/uL (ref 4.22–5.81)
RDW: 12.6 % (ref 11.5–15.5)
WBC: 7.3 10*3/uL (ref 4.0–10.5)
nRBC: 0 % (ref 0.0–0.2)

## 2021-03-03 LAB — BASIC METABOLIC PANEL
Anion gap: 7 (ref 5–15)
BUN: 28 mg/dL — ABNORMAL HIGH (ref 6–20)
CO2: 24 mmol/L (ref 22–32)
Calcium: 9.2 mg/dL (ref 8.9–10.3)
Chloride: 107 mmol/L (ref 98–111)
Creatinine, Ser: 0.86 mg/dL (ref 0.61–1.24)
GFR, Estimated: >60 mL/min (ref >60)
Glucose, Bld: 91 mg/dL (ref 70–99)
Potassium: 4.4 mmol/L (ref 3.5–5.1)
Sodium: 138 mmol/L (ref 135–145)

## 2021-03-03 LAB — ECHOCARDIOGRAM COMPLETE
AV Peak grad: 10.8 mmHg
Ao pk vel: 1.64 m/s
Area-P 1/2: 2.62 cm2
S' Lateral: 3.29 cm

## 2021-03-03 LAB — HIV ANTIBODY (ROUTINE TESTING W REFLEX): HIV Screen 4th Generation wRfx: NONREACTIVE

## 2021-03-03 MED ORDER — METOPROLOL SUCCINATE ER 25 MG PO TB24: 12.5000 mg | ORAL_TABLET | Freq: Every day | ORAL | 0 refills | Status: DC

## 2021-03-03 NOTE — Consult Note (Signed)
CARDIOLOGY CONSULT NOTE               Patient ID: Randall Fields MRN: [id number] DOB/AGE: Oct 21, 1975 45 y.o.  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  Patient is followed at [facility] by Dr. Regino Schultze patient has a history of atrial fibrillation status post ablation chronic anticoagulation with Eliquis hypertension presented to the ER with multiple episodes of syncope patient complains of feeling dizzy and weak and blacked out twice he was very diaphoretic emergency room he was found to be bradycardic in the 30s and 40s denies any chest pain patient other laboratories are basically unremarkable EKG just had sinus rhythm with LVH nonspecific T2 changes he has underlying history of WPW as well again status post ablation in the past patient had a CT the ER of the head which is unremarkable cardiology was consulted because the patient's heart rate was in the 40s on metoprolol of 25 a day  Review of systems complete and found to be negative unless listed above     Past Medical History:  Diagnosis Date  . Atrial fibrillation (HCC)   . Hypercholesteremia   . Hypertension   . Palpitation   . Sleep apnea   . Wolff-Parkinson-White syndrome     Past Surgical History:  Procedure Laterality Date  . ELECTROPHYSIOLOGIC STUDY N/A 01/18/2016   Procedure: CARDIOVERSION;  Surgeon: Laurier Nancy, MD;  Location: [facility] ORS;  Service: Cardiovascular;  Laterality: N/A;  . TEE WITHOUT CARDIOVERSION N/A 01/18/2016   Procedure: TRANSESOPHAGEAL ECHOCARDIOGRAM (TEE);  Surgeon: Laurier Nancy, MD;  Location: [facility] ORS;  Service: Cardiovascular;  Laterality: N/A;    Medications Prior to Admission  Medication Sig Dispense Refill Last Dose  .  acetaminophen (TYLENOL) 500 MG tablet Take 500-1,000 mg by mouth every 6 (six) hours as needed for mild pain or moderate pain.   Unknown at PRN  . apixaban (ELIQUIS) 5 MG TABS tablet Take 5 mg by mouth 2 (two) times daily.  6 03/02/2021 at 0500  . Buprenorphine HCl-Naloxone HCl 8-2 MG FILM Place 1 Film under the tongue every morning.  0 03/02/2021 at 0500  . lisinopril (ZESTRIL) 5 MG tablet Take 5 mg by mouth daily.   03/02/2021 at 0500  . metoprolol succinate (TOPROL-XL) 25 MG 24 hr tablet Take 25 mg by mouth daily.   03/02/2021 at 0500   Social History   Socioeconomic History  . Marital status: Married    Spouse name: Not on file  . Number of children: Not on file  . Years of education: Not on file  . Highest education level: Not on file  Occupational History  . Not on file  Tobacco Use  . Smoking status: Current Every Day Smoker    Types: E-cigarettes  . Smokeless tobacco: Current User    Types: Chew  Substance and Sexual Activity  . Alcohol use: No  . Drug use: No  . Sexual activity: Not on file  Other Topics Concern  . Not on file  Social History Narrative  . Not on file   Social Determinants of Health   Financial Resource Strain: Not on file  Food Insecurity: Not on file  Transportation Needs: Not on file  Physical Activity: Not on file  Stress: Not on file  Social Connections:  Not on file  Intimate Partner Violence: Not on file    Family History  Problem Relation Age of Onset  . Arrhythmia Father       Review of systems complete and found to be negative unless listed above      PHYSICAL EXAM  General: Well developed, well nourished, in no acute distress HEENT:  Normocephalic and atramatic Neck:  No JVD.  Lungs: Clear bilaterally to auscultation and percussion. Heart: HRRR . Normal S1 and S2 without gallops or 3/6 sem murmurs.  Abdomen: Bowel sounds are positive, abdomen soft and non-tender  Msk:  Back normal, normal gait. Normal strength and tone for  age. Extremities: No clubbing, cyanosis or edema.   Neuro: Alert and oriented X 3. Psych:  Good affect, responds appropriately  Labs:   Lab Results  Component Value Date   WBC 7.3 03/03/2021   HGB 14.1 03/03/2021   HCT 42.6 03/03/2021   MCV 86.6 03/03/2021   PLT 181 03/03/2021    Recent Labs  Lab 03/03/21 0533  NA 138  K 4.4  CL 107  CO2 24  BUN 28*  CREATININE 0.86  CALCIUM 9.2  GLUCOSE 91   Lab Results  Component Value Date   TROPONINI 0.15 (HH) 07/09/2018    Lab Results  Component Value Date   CHOL 167 06/17/2017   Lab Results  Component Value Date   HDL 27 (L) 06/17/2017   Lab Results  Component Value Date   LDLCALC 72 06/17/2017   Lab Results  Component Value Date   TRIG 342 (H) 06/17/2017   Lab Results  Component Value Date   CHOLHDL 6.2 06/17/2017   No results found for: LDLDIRECT    Radiology: CT Head Wo Contrast  Result Date: 03/02/2021 CLINICAL DATA:  Dizziness. EXAM: CT HEAD WITHOUT CONTRAST TECHNIQUE: Contiguous axial images were obtained from the base of the skull through the vertex without intravenous contrast. COMPARISON:  Head CT and MRI 07/09/2018 FINDINGS: Brain: There is no evidence of an acute infarct, intracranial hemorrhage, mass, midline shift, or extra-axial fluid collection. Ventricles and sulci are normal. Vascular: No hyperdense vessel. Skull: No fracture or suspicious osseous lesion. Sinuses/Orbits: Mild bilateral ethmoid air cell mucosal thickening. Clear mastoid air cells. Unremarkable orbits. Other: None. IMPRESSION: Unremarkable CT appearance of the brain. Electronically Signed   By: Sebastian Ache M.D.   On: 03/02/2021 16:11    EKG: NSR LVH STTW 65  ASSESSMENT AND PLAN:  Syncope HOCM Bradycardia History of atrial fibrillation status post ablation WPW Hypertension Hyperlipidemia Obstructive sleep apnea Atrial fibrillation . Plan Agree with telemetry Recommend reducing metoprolol dose because of bradycardia Maintain  adequate hydration Continue Eliquis for anticoagulation Recommend ambulation in the halls Continue therapy for obstructive sleep apnea Recommend statin therapy for hyperlipidemia Continue current therapy for hypertension Case has been discussed with Dr. Consuella Lose at [facility] Agree with early discharge follow-up with cardiology    Signed: Alwyn Pea MD 03/03/2021, 8:49 AM

## 2021-03-03 NOTE — Progress Notes (Signed)
Brief Cardiology Consult Note  Imp Syncope  HOCM Bradycardia  . Plan Reduce metoprolol by half Hydration  Ambulate  Echo hyper dynamic EF>75% F/U with DrWang Duke Case discussed with Dr Regino Schultze Agree with discharge today Thanks, Derian Dimalanta

## 2021-03-03 NOTE — Progress Notes (Signed)
Discharge instructions explained to pt/ verbalized an understanding / iv and tele removed/ transported off unit via wheelchair.  

## 2021-03-03 NOTE — Discharge Instructions (Signed)
Near-Syncope Near-syncope is when you suddenly get weak or dizzy, or you feel like you might pass out (faint). This may also be called presyncope. This is due to a lack of blood flow to the brain. During an episode of near-syncope, you may:  Feel dizzy, weak, or light-headed.  Feel sick to your stomach (nauseous).  See all white or all black.  See spots.  Have cold, clammy skin. This condition is caused by a sudden decrease in blood flow to the brain. This decrease can result from various causes, but most of those causes are not dangerous. However, near-syncope may be a sign of a serious medical problem, so it is important to seek medical care. Follow these instructions at home: Medicines  Take over-the-counter and prescription medicines only as told by your doctor.  If you are taking blood pressure or heart medicine, get up slowly and spend many minutes getting ready to sit and then stand. This can help with dizziness. General instructions  Be aware of any changes in your symptoms.  Talk with your doctor about your symptoms. You may need to have testing to find the cause of your near-syncope.  If you start to feel like you might pass out, lie down right away. Raise (elevate) your feet above the level of your heart. Breathe deeply and steadily. Wait until all of the symptoms are gone.  Have someone stay with you until you feel stable.  Do not drive, use machinery, or play sports until your doctor says it is okay.  Drink enough fluid to keep your pee (urine) pale yellow.  Keep all follow-up visits as told by your doctor. This is important. Get help right away if you:  Have a seizure.  Have pain in your: ? Chest. ? Belly (abdomen). ? Back.  Faint once or more than once.  Have a very bad headache.  Are bleeding from your mouth or butt.  Have black or tarry poop (stool).  Have a very fast or uneven heartbeat (palpitations).  Are mixed up (confused).  Have trouble  walking.  Are very weak.  Have trouble seeing. These symptoms may be an emergency. Do not wait to see if the symptoms will go away. Get medical help right away. Call your local emergency services (911 in the [place] Do not drive yourself to the hospital. Summary  Near-syncope is when you suddenly get weak or dizzy, or you feel like you might pass out (faint).  This condition is caused by a lack of blood flow to the brain.  Near-syncope may be a sign of a serious medical problem, so it is important to seek medical care. This information is not intended to replace advice given to you by your health care provider. Make sure you discuss any questions you have with your health care provider. Document Revised: 01/10/2019 Document Reviewed: 08/07/2018 Elsevier Patient Education  2021 Elsevier Inc.  

## 2021-03-05 NOTE — Discharge Summary (Signed)
[place] at [facility]   PATIENT NAME: Randall Fields    MR#:  [id number]  DATE OF BIRTH:  04-01-1976  DATE OF ADMISSION:  03/02/2021   ADMITTING PHYSICIAN: Lucile Shutters, MD  DATE OF DISCHARGE: 03/03/2021  3:45 PM  PRIMARY CARE PHYSICIAN: Fayrene Helper, NP   ADMISSION DIAGNOSIS:  Syncope and collapse [R55] Bradycardia [R00.1] Syncope, unspecified syncope type [R55] DISCHARGE DIAGNOSIS:  Principal Problem:   Syncope Active Problems:   Wolff-Parkinson-White (WPW) syndrome   AF (paroxysmal atrial fibrillation) (HCC)   Essential hypertension   Bradycardia  SECONDARY DIAGNOSIS:   Past Medical History:  Diagnosis Date  . Atrial fibrillation (HCC)   . Hypercholesteremia   . Hypertension   . Palpitation   . Sleep apnea   . Wolff-Parkinson-White syndrome    HOSPITAL COURSE:  45 y.o. 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  Syncope and collapse Most likely secondary to bradycardia Patient's heart rate at rest has been in the low 40s Seen by cardio. Dr Juliann Pares d/w patient's EP Dr Consuella Lose at [facility] who is in agreement to cut metoprolol to 12.5 mg po daily (from 25 mg daily). Patient ambulated without any recurrence of symptoms  Paroxysmal atrial fibrillation Continue Eliquis as primary prophylaxis for an acute stroke  Hypertension Continue lisinopril/HCTZ  DISCHARGE CONDITIONS:  stable CONSULTS OBTAINED:   DRUG ALLERGIES:   Allergies  Allergen Reactions  . Ceclor [Cefaclor] Hives    hives   DISCHARGE MEDICATIONS:   Allergies as of 03/03/2021      Reactions   Ceclor [cefaclor] Hives   hives      Medication List    TAKE these medications   acetaminophen 500 MG  tablet Commonly known as: TYLENOL Take 500-1,000 mg by mouth every 6 (six) hours as needed for mild pain or moderate pain.   apixaban 5 MG Tabs tablet Commonly known as: ELIQUIS Take 5 mg by mouth 2 (two) times daily.   Buprenorphine HCl-Naloxone HCl 8-2 MG Film Place 1 Film under the tongue every morning.   lisinopril 5 MG tablet Commonly known as: ZESTRIL Take 5 mg by mouth daily.   metoprolol succinate 25 MG 24 hr tablet Commonly known as: TOPROL-XL Take 0.5 tablets (12.5 mg total) by mouth daily. What changed: how much to take      DISCHARGE INSTRUCTIONS:   DIET:  Cardiac diet DISCHARGE CONDITION:  Stable ACTIVITY:  Activity as tolerated OXYGEN:  Home Oxygen: No.  Oxygen Delivery: room air DISCHARGE LOCATION:  home   If you experience worsening of your admission symptoms, develop shortness of breath, life threatening emergency, suicidal or homicidal thoughts you must seek medical attention immediately by calling 911 or calling your MD immediately  if symptoms less severe.  You Must read complete instructions/literature along with all the possible adverse reactions/side effects for all the Medicines you take and that have been prescribed to you. Take any new Medicines after you have completely understood and accpet all the possible adverse reactions/side effects.   Please note  You were cared for by a hospitalist during your hospital stay. If you have any questions about your discharge medications or the care you received while you were in the hospital after you are discharged, you can call the unit and asked to speak with the hospitalist on call if  the hospitalist that took care of you is not available. Once you are discharged, your primary care physician will handle any further medical issues. Please note that NO REFILLS for any discharge medications will be authorized once you are discharged, as it is imperative that you return to your primary care physician (or  establish a relationship with a primary care physician if you do not have one) for your aftercare needs so that they can reassess your need for medications and monitor your lab values.    On the day of Discharge:  VITAL SIGNS:  Blood pressure (!) 141/73, pulse (!) 46, temperature 97.7 F (36.5 C), resp. rate 19, height 6\' 1"  (1.854 m), weight 83.1 kg, SpO2 100 %. PHYSICAL EXAMINATION:  GENERAL:  45 y.o.-year-old patient lying in the bed with no acute distress.  EYES: Pupils equal, round, reactive to light and accommodation. No scleral icterus. Extraocular muscles intact.  HEENT: Head atraumatic, normocephalic. Oropharynx and nasopharynx clear.  NECK:  Supple, no jugular venous distention. No thyroid enlargement, no tenderness.  LUNGS: Normal breath sounds bilaterally, no wheezing, rales,rhonchi or crepitation. No use of accessory muscles of respiration.  CARDIOVASCULAR: S1, S2 normal. No murmurs, rubs, or gallops.  ABDOMEN: Soft, non-tender, non-distended. Bowel sounds present. No organomegaly or mass.  EXTREMITIES: No pedal edema, cyanosis, or clubbing.  NEUROLOGIC: Cranial nerves II through XII are intact. Muscle strength 5/5 in all extremities. Sensation intact. Gait not checked.  PSYCHIATRIC: The patient is alert and oriented x 3.  SKIN: No obvious rash, lesion, or ulcer.  DATA REVIEW:   CBC Recent Labs  Lab 03/03/21 0533  WBC 7.3  HGB 14.1  HCT 42.6  PLT 181    Chemistries  Recent Labs  Lab 03/02/21 1438 03/03/21 0533  NA 138 138  K 4.2 4.4  CL 102 107  CO2 27 24  GLUCOSE 107* 91  BUN 34* 28*  CREATININE 1.15 0.86  CALCIUM 9.6 9.2  MG 2.0  --      Outpatient follow-up  Follow-up Information    Fayrene Helper, NP. Go on 03/07/2021.   Specialty: Nurse Practitioner Why: @ 2:30pm. Please arrive by 2:15pm Contact information: [address] [place] [address] [phone]        Nolon Rod, MD. Go on 04/18/2021.   Specialty: Internal Medicine Why:  @ 3:00pm Patient can also access this information on [facility]. Contact information: [address] [place] [address] [phone]                  Management plans discussed with the patient, family and they are in agreement.  CODE STATUS: Prior   TOTAL TIME TAKING CARE OF THIS PATIENT: 45 minutes.    Delfino Lovett M.D on 03/05/2021 at 9:43 AM  [facility]   CC: Primary care physician; Fayrene Helper, NP   Note: This dictation was prepared with Dragon dictation along with smaller phrase technology. Any transcriptional errors that result from this process are unintentional.

## 2021-03-15 DIAGNOSIS — Z9889 Other specified postprocedural states: Secondary | ICD-10-CM | POA: Diagnosis not present

## 2021-03-15 DIAGNOSIS — I428 Other cardiomyopathies: Secondary | ICD-10-CM | POA: Diagnosis not present

## 2021-03-15 DIAGNOSIS — I480 Paroxysmal atrial fibrillation: Secondary | ICD-10-CM | POA: Diagnosis not present

## 2021-03-15 DIAGNOSIS — R55 Syncope and collapse: Secondary | ICD-10-CM | POA: Diagnosis not present

## 2021-03-15 DIAGNOSIS — I10 Essential (primary) hypertension: Secondary | ICD-10-CM | POA: Diagnosis not present

## 2021-04-01 DIAGNOSIS — R69 Illness, unspecified: Secondary | ICD-10-CM | POA: Diagnosis not present

## 2021-04-01 DIAGNOSIS — R55 Syncope and collapse: Secondary | ICD-10-CM | POA: Diagnosis not present

## 2021-04-01 DIAGNOSIS — M5459 Other low back pain: Secondary | ICD-10-CM | POA: Diagnosis not present

## 2021-04-01 DIAGNOSIS — Z5181 Encounter for therapeutic drug level monitoring: Secondary | ICD-10-CM | POA: Diagnosis not present

## 2021-04-01 DIAGNOSIS — I4891 Unspecified atrial fibrillation: Secondary | ICD-10-CM | POA: Diagnosis not present

## 2021-04-01 DIAGNOSIS — Z79899 Other long term (current) drug therapy: Secondary | ICD-10-CM | POA: Diagnosis not present

## 2021-04-10 DIAGNOSIS — Z7901 Long term (current) use of anticoagulants: Secondary | ICD-10-CM | POA: Diagnosis not present

## 2021-04-10 DIAGNOSIS — R69 Illness, unspecified: Secondary | ICD-10-CM | POA: Diagnosis not present

## 2021-04-10 DIAGNOSIS — I480 Paroxysmal atrial fibrillation: Secondary | ICD-10-CM | POA: Diagnosis not present

## 2021-04-10 DIAGNOSIS — I422 Other hypertrophic cardiomyopathy: Secondary | ICD-10-CM | POA: Diagnosis not present

## 2021-04-10 DIAGNOSIS — G8929 Other chronic pain: Secondary | ICD-10-CM | POA: Diagnosis not present

## 2021-04-10 DIAGNOSIS — I442 Atrioventricular block, complete: Secondary | ICD-10-CM | POA: Diagnosis not present

## 2021-04-10 DIAGNOSIS — R079 Chest pain, unspecified: Secondary | ICD-10-CM | POA: Diagnosis not present

## 2021-04-10 DIAGNOSIS — Z20822 Contact with and (suspected) exposure to covid-19: Secondary | ICD-10-CM | POA: Diagnosis not present

## 2021-04-10 DIAGNOSIS — I10 Essential (primary) hypertension: Secondary | ICD-10-CM | POA: Diagnosis not present

## 2021-04-10 DIAGNOSIS — I472 Ventricular tachycardia: Secondary | ICD-10-CM | POA: Diagnosis not present

## 2021-04-10 DIAGNOSIS — R55 Syncope and collapse: Secondary | ICD-10-CM | POA: Diagnosis not present

## 2021-04-12 DIAGNOSIS — I428 Other cardiomyopathies: Secondary | ICD-10-CM | POA: Diagnosis not present

## 2021-04-12 DIAGNOSIS — R079 Chest pain, unspecified: Secondary | ICD-10-CM | POA: Diagnosis not present

## 2021-04-12 DIAGNOSIS — R001 Bradycardia, unspecified: Secondary | ICD-10-CM | POA: Diagnosis not present

## 2021-04-12 DIAGNOSIS — I442 Atrioventricular block, complete: Secondary | ICD-10-CM | POA: Diagnosis not present

## 2021-04-12 DIAGNOSIS — R55 Syncope and collapse: Secondary | ICD-10-CM | POA: Diagnosis not present

## 2021-04-17 DIAGNOSIS — R55 Syncope and collapse: Secondary | ICD-10-CM | POA: Diagnosis not present

## 2021-04-18 DIAGNOSIS — I428 Other cardiomyopathies: Secondary | ICD-10-CM | POA: Diagnosis not present

## 2021-04-18 DIAGNOSIS — I480 Paroxysmal atrial fibrillation: Secondary | ICD-10-CM | POA: Diagnosis not present

## 2021-04-29 DIAGNOSIS — R69 Illness, unspecified: Secondary | ICD-10-CM | POA: Diagnosis not present

## 2021-04-29 DIAGNOSIS — Z79899 Other long term (current) drug therapy: Secondary | ICD-10-CM | POA: Diagnosis not present

## 2021-04-29 DIAGNOSIS — Z5181 Encounter for therapeutic drug level monitoring: Secondary | ICD-10-CM | POA: Diagnosis not present

## 2021-04-29 DIAGNOSIS — M5459 Other low back pain: Secondary | ICD-10-CM | POA: Diagnosis not present

## 2021-05-26 DIAGNOSIS — Z95810 Presence of automatic (implantable) cardiac defibrillator: Secondary | ICD-10-CM | POA: Diagnosis not present

## 2021-05-30 DIAGNOSIS — Z7189 Other specified counseling: Secondary | ICD-10-CM | POA: Diagnosis not present

## 2021-05-30 DIAGNOSIS — M5459 Other low back pain: Secondary | ICD-10-CM | POA: Diagnosis not present

## 2021-05-30 DIAGNOSIS — I10 Essential (primary) hypertension: Secondary | ICD-10-CM | POA: Diagnosis not present

## 2021-07-08 DIAGNOSIS — I4891 Unspecified atrial fibrillation: Secondary | ICD-10-CM | POA: Diagnosis not present

## 2021-07-08 DIAGNOSIS — R69 Illness, unspecified: Secondary | ICD-10-CM | POA: Diagnosis not present

## 2021-07-08 DIAGNOSIS — M5459 Other low back pain: Secondary | ICD-10-CM | POA: Diagnosis not present

## 2021-08-18 DIAGNOSIS — I471 Supraventricular tachycardia: Secondary | ICD-10-CM | POA: Diagnosis not present

## 2021-08-18 DIAGNOSIS — I442 Atrioventricular block, complete: Secondary | ICD-10-CM | POA: Diagnosis not present

## 2021-08-18 DIAGNOSIS — Z23 Encounter for immunization: Secondary | ICD-10-CM | POA: Diagnosis not present

## 2021-08-18 DIAGNOSIS — Z95810 Presence of automatic (implantable) cardiac defibrillator: Secondary | ICD-10-CM | POA: Diagnosis not present

## 2021-08-18 DIAGNOSIS — I422 Other hypertrophic cardiomyopathy: Secondary | ICD-10-CM | POA: Diagnosis not present

## 2021-08-18 DIAGNOSIS — I495 Sick sinus syndrome: Secondary | ICD-10-CM | POA: Diagnosis not present

## 2021-09-02 DIAGNOSIS — I4891 Unspecified atrial fibrillation: Secondary | ICD-10-CM | POA: Diagnosis not present

## 2021-09-02 DIAGNOSIS — R69 Illness, unspecified: Secondary | ICD-10-CM | POA: Diagnosis not present

## 2021-09-02 DIAGNOSIS — Z5181 Encounter for therapeutic drug level monitoring: Secondary | ICD-10-CM | POA: Diagnosis not present

## 2021-09-02 DIAGNOSIS — M5459 Other low back pain: Secondary | ICD-10-CM | POA: Diagnosis not present

## 2021-10-27 DIAGNOSIS — I4891 Unspecified atrial fibrillation: Secondary | ICD-10-CM | POA: Diagnosis not present

## 2021-10-27 DIAGNOSIS — M5459 Other low back pain: Secondary | ICD-10-CM | POA: Diagnosis not present

## 2021-11-18 DIAGNOSIS — Z95810 Presence of automatic (implantable) cardiac defibrillator: Secondary | ICD-10-CM | POA: Diagnosis not present

## 2021-11-22 DIAGNOSIS — Z95810 Presence of automatic (implantable) cardiac defibrillator: Secondary | ICD-10-CM | POA: Diagnosis not present

## 2021-11-29 DIAGNOSIS — E782 Mixed hyperlipidemia: Secondary | ICD-10-CM | POA: Diagnosis not present

## 2021-11-29 DIAGNOSIS — I4891 Unspecified atrial fibrillation: Secondary | ICD-10-CM | POA: Diagnosis not present

## 2021-11-29 DIAGNOSIS — I10 Essential (primary) hypertension: Secondary | ICD-10-CM | POA: Diagnosis not present

## 2021-12-19 DIAGNOSIS — R7301 Impaired fasting glucose: Secondary | ICD-10-CM | POA: Diagnosis not present

## 2021-12-19 DIAGNOSIS — E669 Obesity, unspecified: Secondary | ICD-10-CM | POA: Diagnosis not present

## 2021-12-19 DIAGNOSIS — R319 Hematuria, unspecified: Secondary | ICD-10-CM | POA: Diagnosis not present

## 2021-12-19 DIAGNOSIS — I10 Essential (primary) hypertension: Secondary | ICD-10-CM | POA: Diagnosis not present

## 2021-12-19 DIAGNOSIS — E782 Mixed hyperlipidemia: Secondary | ICD-10-CM | POA: Diagnosis not present

## 2021-12-19 DIAGNOSIS — M109 Gout, unspecified: Secondary | ICD-10-CM | POA: Diagnosis not present

## 2021-12-22 DIAGNOSIS — M5459 Other low back pain: Secondary | ICD-10-CM | POA: Diagnosis not present

## 2021-12-22 DIAGNOSIS — I4891 Unspecified atrial fibrillation: Secondary | ICD-10-CM | POA: Diagnosis not present

## 2021-12-22 DIAGNOSIS — R69 Illness, unspecified: Secondary | ICD-10-CM | POA: Diagnosis not present

## 2021-12-22 DIAGNOSIS — F1120 Opioid dependence, uncomplicated: Secondary | ICD-10-CM | POA: Diagnosis not present

## 2021-12-23 DIAGNOSIS — M109 Gout, unspecified: Secondary | ICD-10-CM | POA: Diagnosis not present

## 2021-12-23 DIAGNOSIS — E782 Mixed hyperlipidemia: Secondary | ICD-10-CM | POA: Diagnosis not present

## 2021-12-23 DIAGNOSIS — I425 Other restrictive cardiomyopathy: Secondary | ICD-10-CM | POA: Diagnosis not present

## 2021-12-23 DIAGNOSIS — I4891 Unspecified atrial fibrillation: Secondary | ICD-10-CM | POA: Diagnosis not present

## 2021-12-23 DIAGNOSIS — R7303 Prediabetes: Secondary | ICD-10-CM | POA: Diagnosis not present

## 2021-12-23 DIAGNOSIS — I10 Essential (primary) hypertension: Secondary | ICD-10-CM | POA: Diagnosis not present

## 2022-02-06 DIAGNOSIS — I428 Other cardiomyopathies: Secondary | ICD-10-CM | POA: Diagnosis not present

## 2022-02-06 DIAGNOSIS — I480 Paroxysmal atrial fibrillation: Secondary | ICD-10-CM | POA: Diagnosis not present

## 2022-02-16 DIAGNOSIS — I4891 Unspecified atrial fibrillation: Secondary | ICD-10-CM | POA: Diagnosis not present

## 2022-02-16 DIAGNOSIS — R69 Illness, unspecified: Secondary | ICD-10-CM | POA: Diagnosis not present

## 2022-02-16 DIAGNOSIS — M5459 Other low back pain: Secondary | ICD-10-CM | POA: Diagnosis not present

## 2022-02-17 DIAGNOSIS — Z95810 Presence of automatic (implantable) cardiac defibrillator: Secondary | ICD-10-CM | POA: Diagnosis not present

## 2022-03-31 DIAGNOSIS — I10 Essential (primary) hypertension: Secondary | ICD-10-CM | POA: Diagnosis not present

## 2022-03-31 DIAGNOSIS — I4891 Unspecified atrial fibrillation: Secondary | ICD-10-CM | POA: Diagnosis not present

## 2022-03-31 DIAGNOSIS — E782 Mixed hyperlipidemia: Secondary | ICD-10-CM | POA: Diagnosis not present

## 2022-04-03 DIAGNOSIS — Z95810 Presence of automatic (implantable) cardiac defibrillator: Secondary | ICD-10-CM | POA: Diagnosis not present

## 2022-04-13 DIAGNOSIS — R69 Illness, unspecified: Secondary | ICD-10-CM | POA: Diagnosis not present

## 2022-04-13 DIAGNOSIS — M5459 Other low back pain: Secondary | ICD-10-CM | POA: Diagnosis not present

## 2022-04-13 DIAGNOSIS — Z5181 Encounter for therapeutic drug level monitoring: Secondary | ICD-10-CM | POA: Diagnosis not present

## 2022-04-13 DIAGNOSIS — I4891 Unspecified atrial fibrillation: Secondary | ICD-10-CM | POA: Diagnosis not present

## 2022-05-19 DIAGNOSIS — Z95810 Presence of automatic (implantable) cardiac defibrillator: Secondary | ICD-10-CM | POA: Diagnosis not present

## 2022-05-28 DIAGNOSIS — R002 Palpitations: Secondary | ICD-10-CM | POA: Diagnosis not present

## 2022-05-28 DIAGNOSIS — Z95810 Presence of automatic (implantable) cardiac defibrillator: Secondary | ICD-10-CM | POA: Diagnosis not present

## 2022-05-28 DIAGNOSIS — R9431 Abnormal electrocardiogram [ECG] [EKG]: Secondary | ICD-10-CM | POA: Diagnosis not present

## 2022-05-28 DIAGNOSIS — Z7901 Long term (current) use of anticoagulants: Secondary | ICD-10-CM | POA: Diagnosis not present

## 2022-05-28 DIAGNOSIS — I10 Essential (primary) hypertension: Secondary | ICD-10-CM | POA: Diagnosis not present

## 2022-05-28 DIAGNOSIS — I480 Paroxysmal atrial fibrillation: Secondary | ICD-10-CM | POA: Diagnosis not present

## 2022-05-28 DIAGNOSIS — R0789 Other chest pain: Secondary | ICD-10-CM | POA: Diagnosis not present

## 2022-05-28 DIAGNOSIS — Z87891 Personal history of nicotine dependence: Secondary | ICD-10-CM | POA: Diagnosis not present

## 2022-05-28 DIAGNOSIS — R0602 Shortness of breath: Secondary | ICD-10-CM | POA: Diagnosis not present

## 2022-05-28 DIAGNOSIS — I4891 Unspecified atrial fibrillation: Secondary | ICD-10-CM | POA: Diagnosis not present

## 2022-06-08 DIAGNOSIS — R69 Illness, unspecified: Secondary | ICD-10-CM | POA: Diagnosis not present

## 2022-06-08 DIAGNOSIS — I4891 Unspecified atrial fibrillation: Secondary | ICD-10-CM | POA: Diagnosis not present

## 2022-06-08 DIAGNOSIS — F1190 Opioid use, unspecified, uncomplicated: Secondary | ICD-10-CM | POA: Diagnosis not present

## 2022-06-08 DIAGNOSIS — M5459 Other low back pain: Secondary | ICD-10-CM | POA: Diagnosis not present

## 2022-06-14 DIAGNOSIS — I495 Sick sinus syndrome: Secondary | ICD-10-CM | POA: Diagnosis not present

## 2022-06-14 DIAGNOSIS — I4819 Other persistent atrial fibrillation: Secondary | ICD-10-CM | POA: Diagnosis not present

## 2022-06-14 DIAGNOSIS — I442 Atrioventricular block, complete: Secondary | ICD-10-CM | POA: Diagnosis not present

## 2022-06-14 DIAGNOSIS — Z4502 Encounter for adjustment and management of automatic implantable cardiac defibrillator: Secondary | ICD-10-CM | POA: Diagnosis not present

## 2022-06-14 DIAGNOSIS — I422 Other hypertrophic cardiomyopathy: Secondary | ICD-10-CM | POA: Diagnosis not present

## 2022-06-14 DIAGNOSIS — I4892 Unspecified atrial flutter: Secondary | ICD-10-CM | POA: Diagnosis not present

## 2022-07-03 DIAGNOSIS — Z95810 Presence of automatic (implantable) cardiac defibrillator: Secondary | ICD-10-CM | POA: Diagnosis not present

## 2022-07-11 DIAGNOSIS — U071 COVID-19: Secondary | ICD-10-CM | POA: Diagnosis not present

## 2022-07-11 DIAGNOSIS — Z20822 Contact with and (suspected) exposure to covid-19: Secondary | ICD-10-CM | POA: Diagnosis not present

## 2022-07-28 DIAGNOSIS — Z8616 Personal history of COVID-19: Secondary | ICD-10-CM | POA: Diagnosis not present

## 2022-07-28 DIAGNOSIS — I10 Essential (primary) hypertension: Secondary | ICD-10-CM | POA: Diagnosis not present

## 2022-07-28 DIAGNOSIS — R7303 Prediabetes: Secondary | ICD-10-CM | POA: Diagnosis not present

## 2022-07-28 DIAGNOSIS — Z9989 Dependence on other enabling machines and devices: Secondary | ICD-10-CM | POA: Diagnosis not present

## 2022-07-28 DIAGNOSIS — Z95810 Presence of automatic (implantable) cardiac defibrillator: Secondary | ICD-10-CM | POA: Diagnosis not present

## 2022-07-28 DIAGNOSIS — G8929 Other chronic pain: Secondary | ICD-10-CM | POA: Diagnosis not present

## 2022-07-28 DIAGNOSIS — I4819 Other persistent atrial fibrillation: Secondary | ICD-10-CM | POA: Diagnosis not present

## 2022-07-28 DIAGNOSIS — M5459 Other low back pain: Secondary | ICD-10-CM | POA: Diagnosis not present

## 2022-07-28 DIAGNOSIS — I428 Other cardiomyopathies: Secondary | ICD-10-CM | POA: Diagnosis not present

## 2022-07-28 DIAGNOSIS — Z8679 Personal history of other diseases of the circulatory system: Secondary | ICD-10-CM | POA: Diagnosis not present

## 2022-07-28 DIAGNOSIS — G4730 Sleep apnea, unspecified: Secondary | ICD-10-CM | POA: Diagnosis not present

## 2022-07-28 DIAGNOSIS — Z7901 Long term (current) use of anticoagulants: Secondary | ICD-10-CM | POA: Diagnosis not present

## 2022-08-01 DIAGNOSIS — I10 Essential (primary) hypertension: Secondary | ICD-10-CM | POA: Diagnosis not present

## 2022-08-01 DIAGNOSIS — I4891 Unspecified atrial fibrillation: Secondary | ICD-10-CM | POA: Diagnosis not present

## 2022-08-01 DIAGNOSIS — E782 Mixed hyperlipidemia: Secondary | ICD-10-CM | POA: Diagnosis not present

## 2022-08-03 DIAGNOSIS — F1120 Opioid dependence, uncomplicated: Secondary | ICD-10-CM | POA: Diagnosis not present

## 2022-08-03 DIAGNOSIS — I4891 Unspecified atrial fibrillation: Secondary | ICD-10-CM | POA: Diagnosis not present

## 2022-08-03 DIAGNOSIS — R69 Illness, unspecified: Secondary | ICD-10-CM | POA: Diagnosis not present

## 2022-08-03 DIAGNOSIS — M5459 Other low back pain: Secondary | ICD-10-CM | POA: Diagnosis not present

## 2022-08-04 DIAGNOSIS — I517 Cardiomegaly: Secondary | ICD-10-CM | POA: Diagnosis not present

## 2022-08-04 DIAGNOSIS — R9431 Abnormal electrocardiogram [ECG] [EKG]: Secondary | ICD-10-CM | POA: Diagnosis not present

## 2022-08-04 DIAGNOSIS — I483 Typical atrial flutter: Secondary | ICD-10-CM | POA: Diagnosis not present

## 2022-08-04 DIAGNOSIS — Z7901 Long term (current) use of anticoagulants: Secondary | ICD-10-CM | POA: Diagnosis not present

## 2022-08-04 DIAGNOSIS — I428 Other cardiomyopathies: Secondary | ICD-10-CM | POA: Diagnosis not present

## 2022-08-04 DIAGNOSIS — I4819 Other persistent atrial fibrillation: Secondary | ICD-10-CM | POA: Diagnosis not present

## 2022-08-04 DIAGNOSIS — I429 Cardiomyopathy, unspecified: Secondary | ICD-10-CM | POA: Diagnosis not present

## 2022-08-04 DIAGNOSIS — G4730 Sleep apnea, unspecified: Secondary | ICD-10-CM | POA: Diagnosis not present

## 2022-08-04 DIAGNOSIS — I440 Atrioventricular block, first degree: Secondary | ICD-10-CM | POA: Diagnosis not present

## 2022-08-04 DIAGNOSIS — I480 Paroxysmal atrial fibrillation: Secondary | ICD-10-CM | POA: Diagnosis not present

## 2022-08-04 DIAGNOSIS — I10 Essential (primary) hypertension: Secondary | ICD-10-CM | POA: Diagnosis not present

## 2022-08-18 DIAGNOSIS — Z95810 Presence of automatic (implantable) cardiac defibrillator: Secondary | ICD-10-CM | POA: Diagnosis not present

## 2022-08-21 DIAGNOSIS — Z95810 Presence of automatic (implantable) cardiac defibrillator: Secondary | ICD-10-CM | POA: Diagnosis not present

## 2022-10-26 DIAGNOSIS — M5459 Other low back pain: Secondary | ICD-10-CM | POA: Diagnosis not present

## 2022-10-26 DIAGNOSIS — R69 Illness, unspecified: Secondary | ICD-10-CM | POA: Diagnosis not present

## 2022-10-26 DIAGNOSIS — I4891 Unspecified atrial fibrillation: Secondary | ICD-10-CM | POA: Diagnosis not present

## 2022-10-31 DIAGNOSIS — E782 Mixed hyperlipidemia: Secondary | ICD-10-CM | POA: Diagnosis not present

## 2022-10-31 DIAGNOSIS — I4891 Unspecified atrial fibrillation: Secondary | ICD-10-CM | POA: Diagnosis not present

## 2022-10-31 DIAGNOSIS — I480 Paroxysmal atrial fibrillation: Secondary | ICD-10-CM | POA: Diagnosis not present

## 2022-10-31 DIAGNOSIS — Z4502 Encounter for adjustment and management of automatic implantable cardiac defibrillator: Secondary | ICD-10-CM | POA: Diagnosis not present

## 2022-10-31 DIAGNOSIS — I10 Essential (primary) hypertension: Secondary | ICD-10-CM | POA: Diagnosis not present

## 2022-12-21 DIAGNOSIS — M5459 Other low back pain: Secondary | ICD-10-CM | POA: Diagnosis not present

## 2022-12-21 DIAGNOSIS — F1120 Opioid dependence, uncomplicated: Secondary | ICD-10-CM | POA: Diagnosis not present

## 2022-12-21 DIAGNOSIS — F1190 Opioid use, unspecified, uncomplicated: Secondary | ICD-10-CM | POA: Diagnosis not present

## 2022-12-21 DIAGNOSIS — F1110 Opioid abuse, uncomplicated: Secondary | ICD-10-CM | POA: Diagnosis not present

## 2022-12-21 DIAGNOSIS — R69 Illness, unspecified: Secondary | ICD-10-CM | POA: Diagnosis not present

## 2022-12-21 DIAGNOSIS — I4891 Unspecified atrial fibrillation: Secondary | ICD-10-CM | POA: Diagnosis not present

## 2023-01-02 ENCOUNTER — Other Ambulatory Visit: Payer: Self-pay | Admitting: Nurse Practitioner

## 2023-01-09 ENCOUNTER — Telehealth: Payer: Self-pay

## 2023-01-09 NOTE — Telephone Encounter (Signed)
Focused Pharmacist Outreach  Samuelson,CHRISTOPHE  46 years, Male  DOB: 1976-09-29  M: [phone]  __________________________________________________ Outreach Details Details of the Visit: A.fib - Ablation Ablation went well. He is not sure when he is going to Cardiology - may be june. He is taking Eliquis, Metoprolol for A.Fib. He is aware of bleeding risk and watches from any bruises. Counseled him to watch the color of the stool and unhealed bruises.  His BP has been "near perfect" the last few times. He is taking Lisinopril 5mg  and Metoprolol ER 25mg  1 tablet in the morning and 1/2 tablet in the evening. Statin: Asked patient about Rosuvastatin. He says he is not taking statin and is not interested in one. The last time he took one his stomach hurt. He wants to control LDL with diet and exercise.  Counseled him about his "high stroke risk" status. Reiterated the importance of keeping the Lipid levels low to prevent plaques formation and blockage of small capillaries. He expressed understanding but insisted that he will not take statin medicine.  I informed him about F/U every 3-4 months and he was appreciative of the pharmacist intervention. Date of next Pharmacist Follow-up: 04/16/2023 Documentation loaded into clinic EMR: Done Care Plan update needed?: N/A . Lynann Bologna, PharmD Televisit and Documentation 

## 2023-01-18 ENCOUNTER — Other Ambulatory Visit: Payer: Self-pay | Admitting: Nurse Practitioner

## 2023-01-18 DIAGNOSIS — I4891 Unspecified atrial fibrillation: Secondary | ICD-10-CM

## 2023-01-30 DIAGNOSIS — I4891 Unspecified atrial fibrillation: Secondary | ICD-10-CM | POA: Diagnosis not present

## 2023-01-30 DIAGNOSIS — I10 Essential (primary) hypertension: Secondary | ICD-10-CM | POA: Diagnosis not present

## 2023-01-30 DIAGNOSIS — E782 Mixed hyperlipidemia: Secondary | ICD-10-CM | POA: Diagnosis not present

## 2023-02-16 DIAGNOSIS — Z95810 Presence of automatic (implantable) cardiac defibrillator: Secondary | ICD-10-CM | POA: Diagnosis not present

## 2023-02-22 DIAGNOSIS — Z95810 Presence of automatic (implantable) cardiac defibrillator: Secondary | ICD-10-CM | POA: Diagnosis not present

## 2023-03-08 DIAGNOSIS — I428 Other cardiomyopathies: Secondary | ICD-10-CM | POA: Diagnosis not present

## 2023-03-08 DIAGNOSIS — I480 Paroxysmal atrial fibrillation: Secondary | ICD-10-CM | POA: Diagnosis not present

## 2023-04-12 DIAGNOSIS — F1120 Opioid dependence, uncomplicated: Secondary | ICD-10-CM | POA: Diagnosis not present

## 2023-04-12 DIAGNOSIS — M5459 Other low back pain: Secondary | ICD-10-CM | POA: Diagnosis not present

## 2023-05-18 DIAGNOSIS — Z8616 Personal history of COVID-19: Secondary | ICD-10-CM | POA: Diagnosis not present

## 2023-05-18 DIAGNOSIS — Z8679 Personal history of other diseases of the circulatory system: Secondary | ICD-10-CM | POA: Diagnosis not present

## 2023-05-18 DIAGNOSIS — R55 Syncope and collapse: Secondary | ICD-10-CM | POA: Diagnosis not present

## 2023-05-18 DIAGNOSIS — I480 Paroxysmal atrial fibrillation: Secondary | ICD-10-CM | POA: Diagnosis not present

## 2023-05-18 DIAGNOSIS — I428 Other cardiomyopathies: Secondary | ICD-10-CM | POA: Diagnosis not present

## 2023-05-18 DIAGNOSIS — Z7901 Long term (current) use of anticoagulants: Secondary | ICD-10-CM | POA: Diagnosis not present

## 2023-05-18 DIAGNOSIS — R42 Dizziness and giddiness: Secondary | ICD-10-CM | POA: Diagnosis not present

## 2023-05-18 DIAGNOSIS — Z9889 Other specified postprocedural states: Secondary | ICD-10-CM | POA: Diagnosis not present

## 2023-05-18 DIAGNOSIS — R002 Palpitations: Secondary | ICD-10-CM | POA: Diagnosis not present

## 2023-05-18 DIAGNOSIS — I4819 Other persistent atrial fibrillation: Secondary | ICD-10-CM | POA: Diagnosis not present

## 2023-05-18 DIAGNOSIS — I10 Essential (primary) hypertension: Secondary | ICD-10-CM | POA: Diagnosis not present

## 2023-05-18 DIAGNOSIS — Z95810 Presence of automatic (implantable) cardiac defibrillator: Secondary | ICD-10-CM | POA: Diagnosis not present

## 2023-06-12 ENCOUNTER — Other Ambulatory Visit: Payer: Self-pay | Admitting: Cardiology

## 2023-06-12 ENCOUNTER — Other Ambulatory Visit: Payer: Self-pay

## 2023-06-12 MED ORDER — METOPROLOL SUCCINATE ER 25 MG PO TB24: 12.5000 mg | ORAL_TABLET | Freq: Every day | ORAL | 0 refills | Status: DC

## 2023-06-14 ENCOUNTER — Other Ambulatory Visit: Payer: Self-pay | Admitting: Cardiology

## 2023-06-14 DIAGNOSIS — Z13220 Encounter for screening for lipoid disorders: Secondary | ICD-10-CM

## 2023-06-14 DIAGNOSIS — R7303 Prediabetes: Secondary | ICD-10-CM

## 2023-06-14 DIAGNOSIS — Z1329 Encounter for screening for other suspected endocrine disorder: Secondary | ICD-10-CM

## 2023-06-14 DIAGNOSIS — I10 Essential (primary) hypertension: Secondary | ICD-10-CM

## 2023-06-21 ENCOUNTER — Encounter: Payer: Self-pay | Admitting: Cardiology

## 2023-06-21 ENCOUNTER — Ambulatory Visit (INDEPENDENT_AMBULATORY_CARE_PROVIDER_SITE_OTHER): Payer: Medicare HMO | Admitting: Cardiology

## 2023-06-21 VITALS — BP 140/78 | HR 70 | Ht 73.0 in | Wt 205.8 lb

## 2023-06-21 DIAGNOSIS — G4730 Sleep apnea, unspecified: Secondary | ICD-10-CM | POA: Diagnosis not present

## 2023-06-21 DIAGNOSIS — Z13220 Encounter for screening for lipoid disorders: Secondary | ICD-10-CM | POA: Diagnosis not present

## 2023-06-21 DIAGNOSIS — R7303 Prediabetes: Secondary | ICD-10-CM | POA: Diagnosis not present

## 2023-06-21 DIAGNOSIS — Z1211 Encounter for screening for malignant neoplasm of colon: Secondary | ICD-10-CM | POA: Diagnosis not present

## 2023-06-21 DIAGNOSIS — Z1329 Encounter for screening for other suspected endocrine disorder: Secondary | ICD-10-CM | POA: Diagnosis not present

## 2023-06-21 DIAGNOSIS — E782 Mixed hyperlipidemia: Secondary | ICD-10-CM | POA: Diagnosis not present

## 2023-06-21 DIAGNOSIS — I10 Essential (primary) hypertension: Secondary | ICD-10-CM | POA: Diagnosis not present

## 2023-06-21 MED ORDER — METOPROLOL SUCCINATE ER 25 MG PO TB24: ORAL_TABLET | ORAL | 3 refills | Status: DC

## 2023-06-21 NOTE — Progress Notes (Signed)
Established Patient Office Visit  Subjective:  Patient ID: Randall Fields, male    DOB: [birth year]  Age: 47 y.o. MRN: [id number]  Chief Complaint  Patient presents with   Follow-up    Medication refills    Patient in office for regular follow up. Has not had blood work  done, patient is fasting today. Patient has not been seen since March 2023. Patient reports feeling well, no complaints today. Continues to see cardiology for his cardiomyopathy, ICD.  Fasting blood work today. Will call patient with results.  Patient due for colon cancer screening. Patient reports bo family history of colon or rectal cancer. Order for Cologuard placed.     No other concerns at this time.   Past Medical History:  Diagnosis Date   Atrial fibrillation (HCC)    Hypercholesteremia    Hypertension    Palpitation    Sleep apnea    Wolff-Parkinson-White syndrome     Past Surgical History:  Procedure Laterality Date   ELECTROPHYSIOLOGIC STUDY N/A 01/18/2016   Procedure: CARDIOVERSION;  Surgeon: Laurier Nancy, MD;  Location: [facility] ORS;  Service: Cardiovascular;  Laterality: N/A;   TEE WITHOUT CARDIOVERSION N/A 01/18/2016   Procedure: TRANSESOPHAGEAL ECHOCARDIOGRAM (TEE);  Surgeon: Laurier Nancy, MD;  Location: [facility] ORS;  Service: Cardiovascular;  Laterality: N/A;    Social History   Socioeconomic History   Marital status: Married    Spouse name: Not on file   Number of children: Not on file   Years of education: Not on file   Highest education level: Not on file  Occupational History   Not on file  Tobacco Use   Smoking status: Every Day    Types: E-cigarettes   Smokeless tobacco: Current    Types: Chew  Substance and Sexual Activity   Alcohol use: No   Drug use: No   Sexual activity: Not on file  Other Topics Concern   Not on file  Social History Narrative   Not on file   Social Determinants of Health   Financial Resource Strain: Not on file  Food Insecurity: Not on file   Transportation Needs: Not on file  Physical Activity: Not on file  Stress: Not on file  Social Connections: Not on file  Intimate Partner Violence: Not on file    Family History  Problem Relation Age of Onset   Arrhythmia Father     Allergies  Allergen Reactions   Ceclor [Cefaclor] Hives    hives    Review of Systems  Constitutional: Negative.   HENT: Negative.    Eyes: Negative.   Respiratory: Negative.  Negative for shortness of breath.   Cardiovascular: Negative.  Negative for chest pain.  Gastrointestinal: Negative.  Negative for abdominal pain, constipation and diarrhea.  Genitourinary: Negative.   Musculoskeletal:  Negative for joint pain and myalgias.  Skin: Negative.   Neurological: Negative.  Negative for dizziness and headaches.  Endo/Heme/Allergies: Negative.   All other systems reviewed and are negative.      Objective:   BP (!) 140/78   Pulse 70   Ht 6\' 1"  (1.854 m)   Wt 205 lb 12.8 oz (93.4 kg)   SpO2 96%   BMI 27.15 kg/m   Vitals:   06/21/23 0927  BP: (!) 140/78  Pulse: 70  Height: 6\' 1"  (1.854 m)  Weight: 205 lb 12.8 oz (93.4 kg)  SpO2: 96%  BMI (Calculated): 27.16    Physical Exam Nursing note reviewed.  Constitutional:  Appearance: Normal appearance. He is normal weight.  HENT:     Head: Normocephalic and atraumatic.     Nose: Nose normal.     Mouth/Throat:     Mouth: Mucous membranes are moist.     Pharynx: Oropharynx is clear.  Eyes:     Extraocular Movements: Extraocular movements intact.     Conjunctiva/sclera: Conjunctivae normal.     Pupils: Pupils are equal, round, and reactive to light.  Cardiovascular:     Rate and Rhythm: Normal rate and regular rhythm.     Pulses: Normal pulses.     Heart sounds: Normal heart sounds.  Pulmonary:     Effort: Pulmonary effort is normal.     Breath sounds: Normal breath sounds.  Abdominal:     General: Abdomen is flat. Bowel sounds are normal.     Palpations: Abdomen is  soft.  Musculoskeletal:        General: Normal range of motion.     Cervical back: Normal range of motion.  Skin:    General: Skin is warm and dry.  Neurological:     General: No focal deficit present.     Mental Status: He is alert and oriented to person, place, and time.  Psychiatric:        Mood and Affect: Mood normal.        Behavior: Behavior normal.        Thought Content: Thought content normal.        Judgment: Judgment normal.      No results found for any visits on 06/21/23.  No results found for this or any previous visit (from the past 2160 hour(s)).    Assessment & Plan:  Cologuard order placed.  Fasting blood work today. Continue appointments with specialists.   Problem List Items Addressed This Visit       Cardiovascular and Mediastinum   Essential hypertension - Primary   Relevant Medications   metoprolol succinate (TOPROL-XL) 25 MG 24 hr tablet     Respiratory   Sleep apnea     Other   Prediabetes   Mixed hyperlipidemia   Relevant Medications   metoprolol succinate (TOPROL-XL) 25 MG 24 hr tablet   Other Visit Diagnoses     Colon cancer screening       Relevant Orders   Cologuard       Return in about 6 months (around 12/19/2023) for with fasting lab work.   Total time spent: 25 minutes  [employer], NP  06/21/2023   This document may have been prepared by Dragon Voice Recognition software and as such may include unintentional dictation errors.

## 2023-06-22 LAB — CBC WITH DIFFERENTIAL/PLATELET
Basophils Absolute: 0 10*3/uL (ref 0.0–0.2)
Basos: 1 %
EOS (ABSOLUTE): 0.2 10*3/uL (ref 0.0–0.4)
Eos: 3 %
Hematocrit: 42.5 % (ref 37.5–51.0)
Hemoglobin: 13.8 g/dL (ref 13.0–17.7)
Immature Grans (Abs): 0 10*3/uL (ref 0.0–0.1)
Immature Granulocytes: 0 %
Lymphocytes Absolute: 2 10*3/uL (ref 0.7–3.1)
Lymphs: 38 %
MCH: 29.2 pg (ref 26.6–33.0)
MCHC: 32.5 g/dL (ref 31.5–35.7)
MCV: 90 fL (ref 79–97)
Monocytes Absolute: 0.5 10*3/uL (ref 0.1–0.9)
Monocytes: 9 %
Neutrophils Absolute: 2.6 10*3/uL (ref 1.4–7.0)
Neutrophils: 49 %
Platelets: 198 10*3/uL (ref 150–450)
RBC: 4.72 x10E6/uL (ref 4.14–5.80)
RDW: 12.7 % (ref 11.6–15.4)
WBC: 5.3 10*3/uL (ref 3.4–10.8)

## 2023-06-22 LAB — CMP14+EGFR
ALT: 30 IU/L (ref 0–44)
AST: 28 IU/L (ref 0–40)
Albumin: 4.4 g/dL (ref 4.1–5.1)
Alkaline Phosphatase: 51 IU/L (ref 44–121)
BUN/Creatinine Ratio: 23 — ABNORMAL HIGH (ref 9–20)
BUN: 21 mg/dL (ref 6–24)
Bilirubin Total: 0.2 mg/dL (ref 0.0–1.2)
CO2: 24 mmol/L (ref 20–29)
Calcium: 10 mg/dL (ref 8.7–10.2)
Chloride: 101 mmol/L (ref 96–106)
Creatinine, Ser: 0.9 mg/dL (ref 0.76–1.27)
Globulin, Total: 2.7 g/dL (ref 1.5–4.5)
Glucose: 92 mg/dL (ref 70–99)
Potassium: 4.8 mmol/L (ref 3.5–5.2)
Sodium: 140 mmol/L (ref 134–144)
Total Protein: 7.1 g/dL (ref 6.0–8.5)
eGFR: 107 mL/min/{1.73_m2} (ref >59)

## 2023-06-22 LAB — HEMOGLOBIN A1C
Est. average glucose Bld gHb Est-mCnc: 120 mg/dL
Hgb A1c MFr Bld: 5.8 % — ABNORMAL HIGH (ref 4.8–5.6)

## 2023-06-22 LAB — LIPID PANEL
Chol/HDL Ratio: 5 ratio (ref 0.0–5.0)
Cholesterol, Total: 189 mg/dL (ref 100–199)
HDL: 38 mg/dL — ABNORMAL LOW (ref >39)
LDL Chol Calc (NIH): 125 mg/dL — ABNORMAL HIGH (ref 0–99)
Triglycerides: 145 mg/dL (ref 0–149)
VLDL Cholesterol Cal: 26 mg/dL (ref 5–40)

## 2023-06-22 LAB — TSH: TSH: 2.55 u[IU]/mL (ref 0.450–4.500)

## 2023-06-29 LAB — COLOGUARD

## 2023-07-05 DIAGNOSIS — I4891 Unspecified atrial fibrillation: Secondary | ICD-10-CM | POA: Diagnosis not present

## 2023-07-05 DIAGNOSIS — M5459 Other low back pain: Secondary | ICD-10-CM | POA: Diagnosis not present

## 2023-07-05 DIAGNOSIS — F1190 Opioid use, unspecified, uncomplicated: Secondary | ICD-10-CM | POA: Diagnosis not present

## 2023-07-31 DIAGNOSIS — F1190 Opioid use, unspecified, uncomplicated: Secondary | ICD-10-CM | POA: Diagnosis not present

## 2023-07-31 DIAGNOSIS — M5459 Other low back pain: Secondary | ICD-10-CM | POA: Diagnosis not present

## 2023-07-31 DIAGNOSIS — I4891 Unspecified atrial fibrillation: Secondary | ICD-10-CM | POA: Diagnosis not present

## 2023-08-17 DIAGNOSIS — Z9889 Other specified postprocedural states: Secondary | ICD-10-CM | POA: Diagnosis not present

## 2023-08-17 DIAGNOSIS — R55 Syncope and collapse: Secondary | ICD-10-CM | POA: Diagnosis not present

## 2023-08-17 DIAGNOSIS — Z8679 Personal history of other diseases of the circulatory system: Secondary | ICD-10-CM | POA: Diagnosis not present

## 2023-08-17 DIAGNOSIS — R42 Dizziness and giddiness: Secondary | ICD-10-CM | POA: Diagnosis not present

## 2023-08-17 DIAGNOSIS — Z95810 Presence of automatic (implantable) cardiac defibrillator: Secondary | ICD-10-CM | POA: Diagnosis not present

## 2023-08-17 DIAGNOSIS — I428 Other cardiomyopathies: Secondary | ICD-10-CM | POA: Diagnosis not present

## 2023-08-17 DIAGNOSIS — I480 Paroxysmal atrial fibrillation: Secondary | ICD-10-CM | POA: Diagnosis not present

## 2023-08-17 DIAGNOSIS — R002 Palpitations: Secondary | ICD-10-CM | POA: Diagnosis not present

## 2023-08-17 DIAGNOSIS — Z8616 Personal history of COVID-19: Secondary | ICD-10-CM | POA: Diagnosis not present

## 2023-08-17 DIAGNOSIS — Z7901 Long term (current) use of anticoagulants: Secondary | ICD-10-CM | POA: Diagnosis not present

## 2023-08-17 DIAGNOSIS — I10 Essential (primary) hypertension: Secondary | ICD-10-CM | POA: Diagnosis not present

## 2023-08-17 DIAGNOSIS — I4819 Other persistent atrial fibrillation: Secondary | ICD-10-CM | POA: Diagnosis not present

## 2023-08-29 DIAGNOSIS — R059 Cough, unspecified: Secondary | ICD-10-CM | POA: Diagnosis not present

## 2023-08-29 DIAGNOSIS — J22 Unspecified acute lower respiratory infection: Secondary | ICD-10-CM | POA: Diagnosis not present

## 2023-12-20 ENCOUNTER — Ambulatory Visit: Payer: Medicare HMO | Admitting: Cardiology

## 2023-12-24 ENCOUNTER — Other Ambulatory Visit: Payer: Self-pay

## 2023-12-24 MED ORDER — LISINOPRIL 5 MG PO TABS: 5.0000 mg | ORAL_TABLET | Freq: Every day | ORAL | 0 refills | Status: DC

## 2024-02-05 ENCOUNTER — Other Ambulatory Visit: Payer: Self-pay

## 2024-02-05 ENCOUNTER — Other Ambulatory Visit: Payer: Self-pay | Admitting: Cardiology

## 2024-02-05 DIAGNOSIS — I4891 Unspecified atrial fibrillation: Secondary | ICD-10-CM

## 2024-02-05 MED ORDER — APIXABAN 5 MG PO TABS
5.0000 mg | ORAL_TABLET | Freq: Two times a day (BID) | ORAL | 4 refills | Status: DC
Start: 2024-02-05 — End: 2024-07-28

## 2024-03-05 ENCOUNTER — Other Ambulatory Visit

## 2024-03-05 DIAGNOSIS — I10 Essential (primary) hypertension: Secondary | ICD-10-CM

## 2024-03-05 DIAGNOSIS — Z1329 Encounter for screening for other suspected endocrine disorder: Secondary | ICD-10-CM

## 2024-03-05 DIAGNOSIS — R7303 Prediabetes: Secondary | ICD-10-CM

## 2024-03-05 DIAGNOSIS — Z13220 Encounter for screening for lipoid disorders: Secondary | ICD-10-CM

## 2024-03-06 ENCOUNTER — Encounter: Payer: Self-pay | Admitting: Cardiology

## 2024-03-06 ENCOUNTER — Ambulatory Visit: Payer: Self-pay | Admitting: Cardiology

## 2024-03-06 ENCOUNTER — Ambulatory Visit (INDEPENDENT_AMBULATORY_CARE_PROVIDER_SITE_OTHER): Admitting: Cardiology

## 2024-03-06 VITALS — BP 122/82 | HR 70 | Ht 73.0 in | Wt 201.8 lb

## 2024-03-06 DIAGNOSIS — R1010 Upper abdominal pain, unspecified: Secondary | ICD-10-CM | POA: Insufficient documentation

## 2024-03-06 DIAGNOSIS — N200 Calculus of kidney: Secondary | ICD-10-CM | POA: Diagnosis not present

## 2024-03-06 DIAGNOSIS — I10 Essential (primary) hypertension: Secondary | ICD-10-CM | POA: Diagnosis not present

## 2024-03-06 DIAGNOSIS — R7303 Prediabetes: Secondary | ICD-10-CM

## 2024-03-06 DIAGNOSIS — E782 Mixed hyperlipidemia: Secondary | ICD-10-CM | POA: Diagnosis not present

## 2024-03-06 LAB — CMP14+EGFR
ALT: 29 IU/L (ref 0–44)
AST: 25 IU/L (ref 0–40)
Albumin: 4.8 g/dL (ref 4.1–5.1)
Alkaline Phosphatase: 50 IU/L (ref 44–121)
BUN/Creatinine Ratio: 26 — ABNORMAL HIGH (ref 9–20)
BUN: 25 mg/dL — ABNORMAL HIGH (ref 6–24)
Bilirubin Total: 0.3 mg/dL (ref 0.0–1.2)
CO2: 22 mmol/L (ref 20–29)
Calcium: 9.8 mg/dL (ref 8.7–10.2)
Chloride: 103 mmol/L (ref 96–106)
Creatinine, Ser: 0.98 mg/dL (ref 0.76–1.27)
Globulin, Total: 2.7 g/dL (ref 1.5–4.5)
Glucose: 99 mg/dL (ref 70–99)
Potassium: 4.8 mmol/L (ref 3.5–5.2)
Sodium: 141 mmol/L (ref 134–144)
Total Protein: 7.5 g/dL (ref 6.0–8.5)
eGFR: 97 mL/min/{1.73_m2} (ref >59)

## 2024-03-06 LAB — POCT URINALYSIS DIPSTICK
Bilirubin, UA: NEGATIVE
Blood, UA: NEGATIVE
Glucose, UA: NEGATIVE
Ketones, UA: NEGATIVE
Leukocytes, UA: NEGATIVE
Nitrite, UA: NEGATIVE
Protein, UA: NEGATIVE
Spec Grav, UA: 1.02 (ref 1.010–1.025)
Urobilinogen, UA: 0.2 U/dL
pH, UA: 5.5 (ref 5.0–8.0)

## 2024-03-06 LAB — CBC WITH DIFFERENTIAL/PLATELET
Basophils Absolute: 0.1 10*3/uL (ref 0.0–0.2)
Basos: 1 %
EOS (ABSOLUTE): 0.2 10*3/uL (ref 0.0–0.4)
Eos: 3 %
Hematocrit: 48.8 % (ref 37.5–51.0)
Hemoglobin: 15.7 g/dL (ref 13.0–17.7)
Immature Grans (Abs): 0 10*3/uL (ref 0.0–0.1)
Immature Granulocytes: 0 %
Lymphocytes Absolute: 3.2 10*3/uL — ABNORMAL HIGH (ref 0.7–3.1)
Lymphs: 47 %
MCH: 29.2 pg (ref 26.6–33.0)
MCHC: 32.2 g/dL (ref 31.5–35.7)
MCV: 91 fL (ref 79–97)
Monocytes Absolute: 0.6 10*3/uL (ref 0.1–0.9)
Monocytes: 9 %
Neutrophils Absolute: 2.7 10*3/uL (ref 1.4–7.0)
Neutrophils: 40 %
Platelets: 219 10*3/uL (ref 150–450)
RBC: 5.38 x10E6/uL (ref 4.14–5.80)
RDW: 12.9 % (ref 11.6–15.4)
WBC: 6.7 10*3/uL (ref 3.4–10.8)

## 2024-03-06 LAB — LIPID PANEL
Chol/HDL Ratio: 5.4 ratio — ABNORMAL HIGH (ref 0.0–5.0)
Cholesterol, Total: 216 mg/dL — ABNORMAL HIGH (ref 100–199)
HDL: 40 mg/dL (ref >39)
LDL Chol Calc (NIH): 128 mg/dL — ABNORMAL HIGH (ref 0–99)
Triglycerides: 268 mg/dL — ABNORMAL HIGH (ref 0–149)
VLDL Cholesterol Cal: 48 mg/dL — ABNORMAL HIGH (ref 5–40)

## 2024-03-06 LAB — HEMOGLOBIN A1C
Est. average glucose Bld gHb Est-mCnc: 120 mg/dL
Hgb A1c MFr Bld: 5.8 % — ABNORMAL HIGH (ref 4.8–5.6)

## 2024-03-06 LAB — TSH: TSH: 2.33 u[IU]/mL (ref 0.450–4.500)

## 2024-03-06 MED ORDER — TAMSULOSIN HCL 0.4 MG PO CAPS: 0.4000 mg | ORAL_CAPSULE | Freq: Every day | ORAL | 6 refills | Status: DC

## 2024-03-06 NOTE — Progress Notes (Signed)
 Established Patient Office Visit  Subjective:  Patient ID: Randall Fields, male    DOB: August 30, 1976  Age: 48 y.o. MRN: [id number]  Chief Complaint  Patient presents with   Abdominal Pain    Passing kidney stones and right side hurting for a week/discuss labs    Patient in office for regular follow up, discuss recent lab work. Patient complains of abdominal pain, recently passed 2 kidney stones, brought them to the office today. Patient also has an inguinal hernia, no prior imaging. Will order a CT of abdomen and pelvis. UA today normal. Will send in Flomax. Discussed recent lab work. Labs stable. Continue same medications.   Abdominal Pain This is a recurrent problem. The current episode started more than 1 month ago. The onset quality is undetermined. The problem occurs intermittently. The problem has been unchanged. The pain is located in the RLQ, RUQ and suprapubic region. The pain is moderate. The quality of the pain is aching. The abdominal pain does not radiate. Associated symptoms include nausea and vomiting. Pertinent negatives include no constipation, diarrhea, headaches or myalgias. Nothing aggravates the pain. The pain is relieved by Nothing. He has tried nothing for the symptoms. kidney stones    No other concerns at this time.   Past Medical History:  Diagnosis Date   Atrial fibrillation (HCC)    Hypercholesteremia    Hypertension    Palpitation    Sleep apnea    Wolff-Parkinson-White syndrome     Past Surgical History:  Procedure Laterality Date   ELECTROPHYSIOLOGIC STUDY N/A 01/18/2016   Procedure: CARDIOVERSION;  Surgeon: Cherrie Cornwall, MD;  Location: [facility];  Service: Cardiovascular;  Laterality: N/A;   TEE WITHOUT CARDIOVERSION N/A 01/18/2016   Procedure: TRANSESOPHAGEAL ECHOCARDIOGRAM (TEE);  Surgeon: Cherrie Cornwall, MD;  Location: [facility];  Service: Cardiovascular;  Laterality: N/A;    Social History   Socioeconomic History   Marital status:  Married    Spouse name: Not on file   Number of children: Not on file   Years of education: Not on file   Highest education level: Not on file  Occupational History   Not on file  Tobacco Use   Smoking status: Every Day    Types: E-cigarettes   Smokeless tobacco: Current    Types: Chew  Substance and Sexual Activity   Alcohol use: No   Drug use: No   Sexual activity: Not on file  Other Topics Concern   Not on file  Social History Narrative   Not on file   Social Drivers of Health   Financial Resource Strain: Not on file  Food Insecurity: Not on file  Transportation Needs: Not on file  Physical Activity: Not on file  Stress: Not on file  Social Connections: Not on file  Intimate Partner Violence: Not on file    Family History  Problem Relation Age of Onset   Arrhythmia Father     Allergies  Allergen Reactions   Ceclor [Cefaclor] Hives    hives    Outpatient Medications Prior to Visit  Medication Sig   acetaminophen  (TYLENOL ) 500 MG tablet Take 500-1,000 mg by mouth every 6 (six) hours as needed for mild pain or moderate pain.   apixaban  (ELIQUIS ) 5 MG TABS tablet Take 1 tablet (5 mg total) by mouth 2 (two) times daily.   Buprenorphine  HCl-Naloxone  HCl 8-2 MG FILM Place 1 Film under the tongue every morning.   lisinopril  (ZESTRIL ) 5 MG tablet TAKE 1 TABLET (5  MG TOTAL) BY MOUTH DAILY.   metoprolol  succinate (TOPROL -XL) 25 MG 24 hr tablet One tablet in AM, 0.5 in the PM.   No facility-administered medications prior to visit.    Review of Systems  Constitutional: Negative.   HENT: Negative.    Eyes: Negative.   Respiratory: Negative.  Negative for shortness of breath.   Cardiovascular: Negative.  Negative for chest pain.  Gastrointestinal:  Positive for abdominal pain, nausea and vomiting. Negative for constipation and diarrhea.  Genitourinary: Negative.   Musculoskeletal:  Negative for joint pain and myalgias.  Skin: Negative.   Neurological: Negative.   Negative for dizziness and headaches.  Endo/Heme/Allergies: Negative.   All other systems reviewed and are negative.      Objective:   BP 122/82   Pulse 70   Ht 6\' 1"  (1.854 m)   Wt 201 lb 12.8 oz (91.5 kg)   SpO2 95%   BMI 26.62 kg/m   Vitals:   03/06/24 0858  BP: 122/82  Pulse: 70  Height: 6\' 1"  (1.854 m)  Weight: 201 lb 12.8 oz (91.5 kg)  SpO2: 95%  BMI (Calculated): 26.63    Physical Exam Nursing note reviewed.  Constitutional:      Appearance: Normal appearance. He is normal weight.  HENT:     Head: Normocephalic and atraumatic.     Nose: Nose normal.     Mouth/Throat:     Mouth: Mucous membranes are moist.     Pharynx: Oropharynx is clear.  Eyes:     Extraocular Movements: Extraocular movements intact.     Conjunctiva/sclera: Conjunctivae normal.     Pupils: Pupils are equal, round, and reactive to light.  Cardiovascular:     Rate and Rhythm: Normal rate and regular rhythm.     Pulses: Normal pulses.     Heart sounds: Normal heart sounds.  Pulmonary:     Effort: Pulmonary effort is normal.     Breath sounds: Normal breath sounds.  Abdominal:     General: Abdomen is flat. Bowel sounds are normal.     Palpations: Abdomen is soft.  Musculoskeletal:        General: Normal range of motion.     Cervical back: Normal range of motion.  Skin:    General: Skin is warm and dry.  Neurological:     General: No focal deficit present.     Mental Status: He is alert and oriented to person, place, and time.  Psychiatric:        Mood and Affect: Mood normal.        Behavior: Behavior normal.        Thought Content: Thought content normal.        Judgment: Judgment normal.      Results for orders placed or performed in visit on 03/06/24  POCT Urinalysis Dipstick (81002)  Result Value Ref Range   Color, UA yellow    Clarity, UA clear    Glucose, UA Negative Negative   Bilirubin, UA negative    Ketones, UA negative    Spec Grav, UA 1.020 1.010 - 1.025    Blood, UA negative    pH, UA 5.5 5.0 - 8.0   Protein, UA Negative Negative   Urobilinogen, UA 0.2 0.2 or 1.0 E.U./dL   Nitrite, UA negative    Leukocytes, UA Negative Negative   Appearance clear    Odor none     Recent Results (from the past 2160 hours)  Hemoglobin A1c  Status: Abnormal   Collection Time: 03/05/24  8:21 AM  Result Value Ref Range   Hgb A1c MFr Bld 5.8 (H) 4.8 - 5.6 %    Comment:          Prediabetes: 5.7 - 6.4          Diabetes: >6.4          Glycemic control for adults with diabetes: <7.0    Est. average glucose Bld gHb Est-mCnc 120 mg/dL  CBC with Differential/Platelet     Status: Abnormal   Collection Time: 03/05/24  8:21 AM  Result Value Ref Range   WBC 6.7 3.4 - 10.8 x10E3/uL   RBC 5.38 4.14 - 5.80 x10E6/uL   Hemoglobin 15.7 13.0 - 17.7 g/dL   Hematocrit 16.1 09.6 - 51.0 %   MCV 91 79 - 97 fL   MCH 29.2 26.6 - 33.0 pg   MCHC 32.2 31.5 - 35.7 g/dL   RDW 04.5 40.9 - 81.1 %   Platelets 219 150 - 450 x10E3/uL   Neutrophils 40 Not Estab. %   Lymphs 47 Not Estab. %   Monocytes 9 Not Estab. %   Eos 3 Not Estab. %   Basos 1 Not Estab. %   Neutrophils Absolute 2.7 1.4 - 7.0 x10E3/uL   Lymphocytes Absolute 3.2 (H) 0.7 - 3.1 x10E3/uL   Monocytes Absolute 0.6 0.1 - 0.9 x10E3/uL   EOS (ABSOLUTE) 0.2 0.0 - 0.4 x10E3/uL   Basophils Absolute 0.1 0.0 - 0.2 x10E3/uL   Immature Granulocytes 0 Not Estab. %   Immature Grans (Abs) 0.0 0.0 - 0.1 x10E3/uL  Lipid panel     Status: Abnormal   Collection Time: 03/05/24  8:21 AM  Result Value Ref Range   Cholesterol, Total 216 (H) 100 - 199 mg/dL   Triglycerides 914 (H) 0 - 149 mg/dL   HDL 40 >78 mg/dL   VLDL Cholesterol Cal 48 (H) 5 - 40 mg/dL   LDL Chol Calc (NIH) 295 (H) 0 - 99 mg/dL   Chol/HDL Ratio 5.4 (H) 0.0 - 5.0 ratio    Comment:                                   T. Chol/HDL Ratio                                             Men  Women                               1/2 Avg.Risk  3.4    3.3                                    Avg.Risk  5.0    4.4                                2X Avg.Risk  9.6    7.1                                3X Avg.Risk 23.4   11.0  CMP14+EGFR     Status: Abnormal   Collection Time: 03/05/24  8:21 AM  Result Value Ref Range   Glucose 99 70 - 99 mg/dL   BUN 25 (H) 6 - 24 mg/dL   Creatinine, Ser 7.82 0.76 - 1.27 mg/dL   eGFR 97 >95 AO/ZHY/8.65   BUN/Creatinine Ratio 26 (H) 9 - 20   Sodium 141 134 - 144 mmol/L   Potassium 4.8 3.5 - 5.2 mmol/L   Chloride 103 96 - 106 mmol/L   CO2 22 20 - 29 mmol/L   Calcium 9.8 8.7 - 10.2 mg/dL   Total Protein 7.5 6.0 - 8.5 g/dL   Albumin 4.8 4.1 - 5.1 g/dL   Globulin, Total 2.7 1.5 - 4.5 g/dL   Bilirubin Total 0.3 0.0 - 1.2 mg/dL   Alkaline Phosphatase 50 44 - 121 IU/L   AST 25 0 - 40 IU/L   ALT 29 0 - 44 IU/L  TSH     Status: None   Collection Time: 03/05/24  8:22 AM  Result Value Ref Range   TSH 2.330 0.450 - 4.500 uIU/mL  POCT Urinalysis Dipstick (78469)     Status: Normal   Collection Time: 03/06/24  9:18 AM  Result Value Ref Range   Color, UA yellow    Clarity, UA clear    Glucose, UA Negative Negative   Bilirubin, UA negative    Ketones, UA negative    Spec Grav, UA 1.020 1.010 - 1.025   Blood, UA negative    pH, UA 5.5 5.0 - 8.0   Protein, UA Negative Negative   Urobilinogen, UA 0.2 0.2 or 1.0 E.U./dL   Nitrite, UA negative    Leukocytes, UA Negative Negative   Appearance clear    Odor none       Assessment & Plan:  CT abdomen pelvis Flomax Continue same medications  Problem List Items Addressed This Visit       Cardiovascular and Mediastinum   Essential hypertension     Genitourinary   Kidney stones - Primary   Relevant Orders   POCT Urinalysis Dipstick (62952) (Completed)     Other   Prediabetes   Mixed hyperlipidemia   Pain of upper abdomen   Relevant Orders   CT ABDOMEN PELVIS WO CONTRAST    Return in about 6 months (around 09/05/2024).   Total time spent: 25 minutes  [employer], NP  03/06/2024   This document may have been prepared by Dragon Voice Recognition software and as such may include unintentional dictation errors.

## 2024-03-11 ENCOUNTER — Ambulatory Visit
Admission: RE | Admit: 2024-03-11 | Discharge: 2024-03-11 | Disposition: A | Discharge status: Home / Self Care | Source: Ambulatory Visit | Attending: Cardiology

## 2024-03-11 DIAGNOSIS — R1010 Upper abdominal pain, unspecified: Secondary | ICD-10-CM

## 2024-03-14 NOTE — Progress Notes (Signed)
 Patient informed.

## 2024-06-29 ENCOUNTER — Other Ambulatory Visit: Payer: Self-pay | Admitting: Cardiology

## 2024-07-26 ENCOUNTER — Other Ambulatory Visit: Payer: Self-pay | Admitting: Cardiology

## 2024-07-26 DIAGNOSIS — I4891 Unspecified atrial fibrillation: Secondary | ICD-10-CM

## 2024-08-29 ENCOUNTER — Other Ambulatory Visit: Payer: Self-pay | Admitting: Cardiology

## 2024-09-03 ENCOUNTER — Ambulatory Visit: Admitting: Cardiology

## 2024-09-28 ENCOUNTER — Other Ambulatory Visit: Payer: Self-pay | Admitting: Cardiology
